# Patient Record
Sex: Female | Born: 1980
Health system: Southern US, Community
[De-identification: ages and names within clinical notes are randomized; demographics above are authoritative.]

## PROBLEM LIST (undated history)

## (undated) DIAGNOSIS — F329 Major depressive disorder, single episode, unspecified: Secondary | ICD-10-CM

## (undated) DIAGNOSIS — L509 Urticaria, unspecified: Secondary | ICD-10-CM

## (undated) DIAGNOSIS — F988 Other specified behavioral and emotional disorders with onset usually occurring in childhood and adolescence: Secondary | ICD-10-CM

## (undated) DIAGNOSIS — J309 Allergic rhinitis, unspecified: Secondary | ICD-10-CM

## (undated) DIAGNOSIS — G43909 Migraine, unspecified, not intractable, without status migrainosus: Secondary | ICD-10-CM

## (undated) DIAGNOSIS — D509 Iron deficiency anemia, unspecified: Secondary | ICD-10-CM

## (undated) DIAGNOSIS — F411 Generalized anxiety disorder: Secondary | ICD-10-CM

## (undated) DIAGNOSIS — E785 Hyperlipidemia, unspecified: Secondary | ICD-10-CM

## (undated) DIAGNOSIS — L259 Unspecified contact dermatitis, unspecified cause: Secondary | ICD-10-CM

## (undated) HISTORY — DX: Allergic rhinitis, unspecified: J30.9

## (undated) HISTORY — PX: TONSILLECTOMY: SUR1361

## (undated) HISTORY — DX: Hyperlipidemia, unspecified: E78.5

## (undated) HISTORY — DX: Generalized anxiety disorder: F41.1

## (undated) HISTORY — DX: Migraine, unspecified, not intractable, without status migrainosus: G43.909

## (undated) HISTORY — DX: Urticaria, unspecified: L50.9

## (undated) HISTORY — DX: Major depressive disorder, single episode, unspecified: F32.9

## (undated) HISTORY — DX: Other specified behavioral and emotional disorders with onset usually occurring in childhood and adolescence: F98.8

## (undated) HISTORY — DX: Unspecified contact dermatitis, unspecified cause: L25.9

## (undated) HISTORY — DX: Iron deficiency anemia, unspecified: D50.9

## (undated) HISTORY — PX: OTHER SURGICAL HISTORY: SHX169

---

## 2002-04-02 ENCOUNTER — Other Ambulatory Visit: Admission: RE | Admit: 2002-04-02 | Discharge: 2002-04-02 | Payer: Self-pay | Admitting: Obstetrics and Gynecology

## 2002-08-26 ENCOUNTER — Other Ambulatory Visit: Admission: RE | Admit: 2002-08-26 | Discharge: 2002-08-26 | Payer: Self-pay | Admitting: Obstetrics and Gynecology

## 2003-04-06 ENCOUNTER — Other Ambulatory Visit: Admission: RE | Admit: 2003-04-06 | Discharge: 2003-04-06 | Payer: Self-pay | Admitting: Obstetrics and Gynecology

## 2003-08-05 ENCOUNTER — Other Ambulatory Visit: Admission: RE | Admit: 2003-08-05 | Discharge: 2003-08-05 | Payer: Self-pay | Admitting: Obstetrics and Gynecology

## 2004-04-27 ENCOUNTER — Ambulatory Visit: Payer: Self-pay | Admitting: Internal Medicine

## 2004-05-03 ENCOUNTER — Other Ambulatory Visit: Admission: RE | Admit: 2004-05-03 | Discharge: 2004-05-03 | Payer: Self-pay | Admitting: Obstetrics and Gynecology

## 2004-07-27 ENCOUNTER — Encounter (INDEPENDENT_AMBULATORY_CARE_PROVIDER_SITE_OTHER): Payer: Self-pay | Admitting: *Deleted

## 2004-07-27 ENCOUNTER — Ambulatory Visit (HOSPITAL_COMMUNITY): Admission: RE | Admit: 2004-07-27 | Discharge: 2004-07-27 | Payer: Self-pay | Admitting: Obstetrics and Gynecology

## 2004-11-23 ENCOUNTER — Ambulatory Visit: Payer: Self-pay | Admitting: Internal Medicine

## 2005-01-19 ENCOUNTER — Other Ambulatory Visit: Admission: RE | Admit: 2005-01-19 | Discharge: 2005-01-19 | Payer: Self-pay | Admitting: Obstetrics and Gynecology

## 2005-04-17 ENCOUNTER — Other Ambulatory Visit: Admission: RE | Admit: 2005-04-17 | Discharge: 2005-04-17 | Payer: Self-pay | Admitting: Obstetrics and Gynecology

## 2005-07-11 ENCOUNTER — Other Ambulatory Visit: Admission: RE | Admit: 2005-07-11 | Discharge: 2005-07-11 | Payer: Self-pay | Admitting: Obstetrics and Gynecology

## 2005-07-31 ENCOUNTER — Ambulatory Visit (HOSPITAL_COMMUNITY): Admission: RE | Admit: 2005-07-31 | Discharge: 2005-07-31 | Payer: Self-pay | Admitting: Obstetrics and Gynecology

## 2005-07-31 ENCOUNTER — Encounter (INDEPENDENT_AMBULATORY_CARE_PROVIDER_SITE_OTHER): Payer: Self-pay | Admitting: *Deleted

## 2005-11-12 ENCOUNTER — Ambulatory Visit: Payer: Self-pay | Admitting: Internal Medicine

## 2005-12-25 ENCOUNTER — Other Ambulatory Visit: Admission: RE | Admit: 2005-12-25 | Discharge: 2005-12-25 | Payer: Self-pay | Admitting: Obstetrics and Gynecology

## 2006-11-06 ENCOUNTER — Ambulatory Visit: Payer: Self-pay | Admitting: Internal Medicine

## 2006-11-09 DIAGNOSIS — F988 Other specified behavioral and emotional disorders with onset usually occurring in childhood and adolescence: Secondary | ICD-10-CM | POA: Insufficient documentation

## 2006-11-09 DIAGNOSIS — G43909 Migraine, unspecified, not intractable, without status migrainosus: Secondary | ICD-10-CM | POA: Insufficient documentation

## 2006-11-09 DIAGNOSIS — F329 Major depressive disorder, single episode, unspecified: Secondary | ICD-10-CM

## 2006-11-09 DIAGNOSIS — F411 Generalized anxiety disorder: Secondary | ICD-10-CM | POA: Insufficient documentation

## 2006-11-09 DIAGNOSIS — F3289 Other specified depressive episodes: Secondary | ICD-10-CM

## 2006-11-09 DIAGNOSIS — F32A Depression, unspecified: Secondary | ICD-10-CM | POA: Insufficient documentation

## 2006-11-09 HISTORY — DX: Other specified depressive episodes: F32.89

## 2006-11-09 HISTORY — DX: Major depressive disorder, single episode, unspecified: F32.9

## 2006-11-09 HISTORY — DX: Migraine, unspecified, not intractable, without status migrainosus: G43.909

## 2006-11-09 HISTORY — DX: Generalized anxiety disorder: F41.1

## 2006-11-09 HISTORY — DX: Other specified behavioral and emotional disorders with onset usually occurring in childhood and adolescence: F98.8

## 2007-09-11 ENCOUNTER — Inpatient Hospital Stay (HOSPITAL_COMMUNITY): Admission: RE | Admit: 2007-09-11 | Discharge: 2007-09-13 | Payer: Self-pay | Admitting: Obstetrics and Gynecology

## 2008-06-03 ENCOUNTER — Ambulatory Visit: Payer: Self-pay | Admitting: Internal Medicine

## 2008-06-03 DIAGNOSIS — D509 Iron deficiency anemia, unspecified: Secondary | ICD-10-CM

## 2008-06-03 DIAGNOSIS — E785 Hyperlipidemia, unspecified: Secondary | ICD-10-CM

## 2008-06-03 HISTORY — DX: Iron deficiency anemia, unspecified: D50.9

## 2008-06-03 HISTORY — DX: Hyperlipidemia, unspecified: E78.5

## 2008-06-04 LAB — CONVERTED CEMR LAB
BUN: 14 mg/dL (ref 6–23)
Basophils Absolute: 0.1 10*3/uL (ref 0.0–0.1)
Basophils Relative: 1 % (ref 0.0–3.0)
Bilirubin Urine: NEGATIVE
CO2: 25 meq/L (ref 19–32)
Calcium: 10 mg/dL (ref 8.4–10.5)
Chloride: 107 meq/L (ref 96–112)
Cholesterol: 153 mg/dL (ref 0–200)
Creatinine, Ser: 0.8 mg/dL (ref 0.4–1.2)
Eosinophils Absolute: 0.1 10*3/uL (ref 0.0–0.7)
Eosinophils Relative: 2.3 % (ref 0.0–5.0)
GFR calc non Af Amer: 92.55 mL/min (ref >60)
Glucose, Bld: 87 mg/dL (ref 70–99)
HCT: 39.9 % (ref 36.0–46.0)
HDL: 46.5 mg/dL (ref >39.00)
Hemoglobin: 13.6 g/dL (ref 12.0–15.0)
Ketones, ur: NEGATIVE mg/dL
LDL Cholesterol: 87 mg/dL (ref 0–99)
Leukocytes, UA: NEGATIVE
Lymphocytes Relative: 43.2 % (ref 12.0–46.0)
Lymphs Abs: 2.3 10*3/uL (ref 0.7–4.0)
MCHC: 34 g/dL (ref 30.0–36.0)
MCV: 89 fL (ref 78.0–100.0)
Monocytes Absolute: 0.5 10*3/uL (ref 0.1–1.0)
Monocytes Relative: 9.4 % (ref 3.0–12.0)
Neutro Abs: 2.4 10*3/uL (ref 1.4–7.7)
Neutrophils Relative %: 44.1 % (ref 43.0–77.0)
Nitrite: NEGATIVE
Platelets: 429 10*3/uL — ABNORMAL HIGH (ref 150.0–400.0)
Potassium: 4.8 meq/L (ref 3.5–5.1)
RBC: 4.49 M/uL (ref 3.87–5.11)
RDW: 13.7 % (ref 11.5–14.6)
Sodium: 143 meq/L (ref 135–145)
Specific Gravity, Urine: 1.02 (ref 1.000–1.030)
TSH: 0.52 microintl units/mL (ref 0.35–5.50)
Total CHOL/HDL Ratio: 3
Total Protein, Urine: NEGATIVE mg/dL
Triglycerides: 98 mg/dL (ref 0.0–149.0)
Urine Glucose: NEGATIVE mg/dL
Urobilinogen, UA: 0.2 (ref 0.0–1.0)
VLDL: 19.6 mg/dL (ref 0.0–40.0)
WBC: 5.4 10*3/uL (ref 4.5–10.5)
pH: 7 (ref 5.0–8.0)

## 2008-06-10 LAB — CONVERTED CEMR LAB
ALT: 13 units/L (ref 0–35)
AST: 17 units/L (ref 0–37)
Albumin: 4.6 g/dL (ref 3.5–5.2)
Alkaline Phosphatase: 75 units/L (ref 39–117)
Bilirubin, Direct: 0.2 mg/dL (ref 0.0–0.3)
Total Bilirubin: 1.1 mg/dL (ref 0.3–1.2)
Total Protein: 7.7 g/dL (ref 6.0–8.3)

## 2008-06-23 ENCOUNTER — Encounter: Payer: Self-pay | Admitting: Internal Medicine

## 2008-12-07 ENCOUNTER — Ambulatory Visit: Payer: Self-pay | Admitting: Internal Medicine

## 2008-12-08 DIAGNOSIS — J309 Allergic rhinitis, unspecified: Secondary | ICD-10-CM

## 2008-12-08 HISTORY — DX: Allergic rhinitis, unspecified: J30.9

## 2009-06-02 ENCOUNTER — Ambulatory Visit: Payer: Self-pay | Admitting: Internal Medicine

## 2009-06-02 DIAGNOSIS — L259 Unspecified contact dermatitis, unspecified cause: Secondary | ICD-10-CM

## 2009-06-02 DIAGNOSIS — L309 Dermatitis, unspecified: Secondary | ICD-10-CM | POA: Insufficient documentation

## 2009-06-02 HISTORY — DX: Unspecified contact dermatitis, unspecified cause: L25.9

## 2009-12-09 ENCOUNTER — Telehealth: Payer: Self-pay | Admitting: Internal Medicine

## 2010-02-22 ENCOUNTER — Encounter: Payer: Self-pay | Admitting: Internal Medicine

## 2010-02-22 ENCOUNTER — Telehealth: Payer: Self-pay | Admitting: Internal Medicine

## 2010-04-18 NOTE — Assessment & Plan Note (Signed)
Summary: FU   $50   D/T   STC   Vital Signs:  Patient profile:   30 year old female Height:      66 inches Weight:      135.50 pounds BMI:     21.95 Temp:     98.4 degrees F oral Pulse rate:   76 / minute BP sitting:   122 / 66  (right arm) Cuff size:   regular  Vitals Entered By: Windell Norfolk (June 03, 2008 1:20 PM)   History of Present Illness: had a child since last seen 8/08; overall doing well, now with IUD in place, wants to re-start meds as prior  to pregnancy but also amenable to daily tx for anxiety such as cymbalta; denies depressive symptoms or recent panic attacks  Preventive Screening-Counseling & Management     Smoking Status: never  Problems Prior to Update: 1)  Preventive Health Care  (ICD-V70.0) 2)  Migraine Headache  (ICD-346.90) 3)  Depression  (ICD-311) 4)  Anxiety  (ICD-300.00) 5)  Add  (ICD-314.00)  Medications Prior to Update: 1)  None  Current Medications (verified): 1)  Adderall Xr 20 Mg Xr24h-Cap (Amphetamine-Dextroamphetamine) .Marland Kitchen.. 1 By Mouth Once Daily - To Fill Aug 02, 2008 2)  Alprazolam 0.5 Mg Tabs (Alprazolam) .... 1/2 - 1 By Mouth Once Daily As Needed Nerves 3)  Cymbalta 60 Mg Cpep (Duloxetine Hcl) .Marland Kitchen.. 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, prison and probation services    Never Smoked    Alcohol use-yes     (06/03/2008)  Risk Factors:    Alcohol Use: N/A    >5 drinks/d w/in last 3 months: N/A    Caffeine Use: N/A    Diet: N/A    Exercise: N/A  Risk Factors:    Smoking Status: never (06/03/2008)    Packs/Day: occasional (11/09/2006)    Cigars/wk: N/A    Pipe Use/wk: N/A    Cans of tobacco/wk: N/A    Passive Smoke Exposure: N/A  Past Medical History:    ADD    Anxiety    Depression    Migraines    Anemia-iron deficiency    Hyperlipidemia  Past Surgical  History:    s/p IUD    Tonsillectomy  Family History:    Reviewed history and no changes required:       father with multiple skin cancers - no melanoma, elevated cholesterol         Social History:    Reviewed history and no changes required:       Single       1 child       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, prison and probation services       Never Smoked       Alcohol use-yes    Smoking Status:  never  Review of Systems  The patient denies anorexia, fever, weight loss, weight gain, vision loss, decreased hearing, hoarseness, chest pain, syncope, dyspnea on exertion, peripheral edema, prolonged cough, headaches, hemoptysis, abdominal pain, melena, hematochezia, severe indigestion/heartburn, hematuria, incontinence, muscle weakness, suspicious skin lesions, transient blindness, difficulty walking, depression, unusual weight change, abnormal bleeding, enlarged lymph nodes, angioedema, and breast masses.         all otherwise negative   Physical Exam  General:  alert and well-developed.   Head:  normocephalic and atraumatic.   Eyes:  vision grossly intact, pupils equal, and pupils round.   Ears:  R ear normal and L ear normal.   Nose:  no external erythema and no nasal discharge.   Mouth:  no gingival abnormalities and pharynx pink and moist.   Neck:  supple and no masses.   Lungs:  normal respiratory effort and normal breath sounds.   Heart:  normal rate and regular rhythm.   Abdomen:  soft, non-tender, and normal bowel sounds.   Msk:  normal ROM, no joint tenderness, and no joint swelling.   Extremities:  no edema, no ulcers  Neurologic:  cranial nerves II-XII intact and strength normal in all extremities.     Impression & Recommendations:  Problem # 1:  Preventive Health Care (ICD-V70.0)  Overall doing well, up to date, counseled on routine health concerns for screening and prevention, immunizations up to date or declined, labs ordered  Orders: TLB-BMP (Basic Metabolic Panel-BMET)  (80048-METABOL) TLB-CBC Platelet - w/Differential (85025-CBCD) TLB-Hepatic/Liver Function Pnl (80076-HEPATIC) TLB-Lipid Panel (80061-LIPID) TLB-TSH (Thyroid Stimulating Hormone) (84443-TSH) TLB-Udip ONLY (81003-UDIP)  Problem # 2:  ANXIETY (ICD-300.00)  Her updated medication list for this problem includes:    Alprazolam 0.5 Mg Tabs (Alprazolam) .Marland Kitchen... 1/2 - 1 by mouth once daily as needed nerves    Cymbalta 60 Mg Cpep (Duloxetine hcl) .Marland Kitchen... 1 by mouth once daily treat as above, f/u any worsening signs or symptoms   Problem # 3:  ADD (ICD-314.00) gave 3 mo rx  - ok to treat as above, f/u any worsening signs or symptoms   Complete Medication List: 1)  Adderall Xr 20 Mg Xr24h-cap (Amphetamine-dextroamphetamine) .Marland Kitchen.. 1 by mouth once daily - to fill Aug 02, 2008 2)  Alprazolam 0.5 Mg Tabs (Alprazolam) .... 1/2 - 1 by mouth once daily as needed nerves 3)  Cymbalta 60 Mg Cpep (Duloxetine hcl) .Marland Kitchen.. 1 by mouth once daily  Other Orders: Tdap => 20yrs IM (51761) Admin 1st Vaccine (60737)  Patient Instructions: 1)  Please go to the Lab in the basement for your blood and urine tests today 2)  Please take all new medications as prescribed - the cymbalta - start the samples at 30 mg per day for 1 wk, then 60 mg per day after that (for nerves) 3)  Continue all medications that you may have been taking previously  - the adderall (you are given 3 prescriptions), and the alprazolam (for nerves) 4)  call if you need further refills 5)  Please schedule a follow-up appointment in 1 year or sooner if needed Prescriptions: CYMBALTA 60 MG CPEP (DULOXETINE HCL) 1 by mouth once daily  #30 x 11   Entered and Authorized by:   Corwin Levins MD   Signed by:   Corwin Levins MD on 06/03/2008   Method used:   Print then Give to Patient   RxID:   [id number] ALPRAZOLAM 0.5 MG TABS (ALPRAZOLAM) 1/2 - 1 by mouth once daily as needed nerves  #30 x 5   Entered and Authorized by:   Corwin Levins MD   Signed by:    Corwin Levins MD on 06/03/2008   Method used:   Print then Give to Patient   RxID:   [id number] ADDERALL XR 20 MG XR24H-CAP (AMPHETAMINE-DEXTROAMPHETAMINE) 1 by mouth once daily - to fill Aug 02, 2008  #30 x 0   Entered and Authorized by:   Fayrene Fearing  Ellin Mayhew MD   Signed by:   Corwin Levins MD on 06/03/2008   Method used:   Print then Give to Patient   RxID:   [id number] ADDERALL XR 20 MG XR24H-CAP (AMPHETAMINE-DEXTROAMPHETAMINE) 1 by mouth once daily - to fill Jul 03, 2008  #30 x 0   Entered and Authorized by:   Corwin Levins MD   Signed by:   Corwin Levins MD on 06/03/2008   Method used:   Print then Give to Patient   RxID:   [id number] ADDERALL XR 20 MG XR24H-CAP (AMPHETAMINE-DEXTROAMPHETAMINE) 1 by mouth once daily - to fill Jun 03, 2008  #30 x 0   Entered and Authorized by:   Corwin Levins MD   Signed by:   Corwin Levins MD on 06/03/2008   Method used:   Print then Give to Patient   RxID:   [id number]    Immunizations Administered:  Tetanus Vaccine:    Vaccine Type: Tdap    Immunizations Administered:  Tetanus Vaccine:    Vaccine Type: Tdap

## 2010-04-18 NOTE — Assessment & Plan Note (Signed)
Summary: FU ON MEDS /NWS   Vital Signs:  Patient profile:   30 year old female Height:      65 inches Weight:      126.25 pounds BMI:     21.09 O2 Sat:      98 % on Room air Temp:     97.8 degrees F oral Pulse rate:   97 / minute BP sitting:   118 / 70  (left arm) Cuff size:   regular  Vitals Entered ByZella Ball Ewing (June 02, 2009 1:40 PM)  O2 Flow:  Room air  CC: followup on meds, left hand rash/RE   CC:  followup on meds and left hand rash/RE.  History of Present Illness: overall doing well, no specific complaints;  Pt denies CP, sob, doe, wheezing, orthopnea, pnd, worsening LE edema, palps, dizziness or syncope   Good med complaicne and excellent tolerance, without undue wt loss, palps, tremors or anxiety increased.  Denies signfiicant worse depressive symptoms. or suicidal ideaiton or panic  No other new problems, successfully employed, needs med refills.  Labs from 2010 reviewed with pt.   Does have some mild nasal allergy congestion adn clearish dic with itch , as well as several areas of eczema type rash to hands.    Problems Prior to Update: 1)  Eczema  (ICD-692.9) 2)  Allergic Rhinitis  (ICD-477.9) 3)  Hyperlipidemia  (ICD-272.4) 4)  Anemia-iron Deficiency  (ICD-280.9) 5)  Preventive Health Care  (ICD-V70.0) 6)  Migraine Headache  (ICD-346.90) 7)  Depression  (ICD-311) 8)  Anxiety  (ICD-300.00) 9)  Add  (ICD-314.00)  Medications Prior to Update: 1)  Adderall Xr 20 Mg Xr24h-Cap (Amphetamine-Dextroamphetamine) .Marland Kitchen.. 1 By Mouth Once Daily - To Fill Feb 05, 2009 2)  Alprazolam 0.5 Mg Tabs (Alprazolam) .... 1/2 - 1 By Mouth Once Daily As Needed Nerves 3)  Citalopram Hydrobromide 20 Mg Tabs (Citalopram Hydrobromide) .Marland Kitchen.. 1 By Mouth Once Daily 4)  Fluticasone Propionate 50 Mcg/act Susp (Fluticasone Propionate) .... 2 Spary/side Once Daily  Current Medications (verified): 1)  Adderall Xr 20 Mg Xr24h-Cap (Amphetamine-Dextroamphetamine) .Marland Kitchen.. 1 By Mouth Once Daily - To Fill  Aug 01, 2009 2)  Alprazolam 0.5 Mg Tabs (Alprazolam) .... 1/2 - 1 By Mouth Once Daily As Needed Nerves 3)  Citalopram Hydrobromide 20 Mg Tabs (Citalopram Hydrobromide) .Marland Kitchen.. 1 By Mouth Once Daily 4)  Fluticasone Propionate 50 Mcg/act Susp (Fluticasone Propionate) .... 2 Spary/side Once Daily 5)  Loratadine 10 Mg Tabs (Loratadine) .Marland Kitchen.. 1po Once Daily As Needed Allergies 6)  Triamcinolone Acetonide 0.1 % Crea (Triamcinolone Acetonide) .... 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 s/p IUD Tonsillectomy  Review of Systems  The patient denies anorexia, fever, weight loss, weight gain, vision loss, decreased hearing, hoarseness, chest pain, syncope, dyspnea on exertion, peripheral edema, prolonged cough, headaches, hemoptysis, abdominal pain, melena, hematochezia, severe indigestion/heartburn, hematuria, muscle weakness, suspicious skin lesions, difficulty walking, depression, unusual weight change, abnormal bleeding, enlarged lymph nodes, and angioedema.         all otherwise negative  per pt -    Physical Exam  General:  alert and well-developed.   Head:  normocephalic and atraumatic.   Eyes:  vision grossly intact, pupils equal, and pupils round.   Ears:  R ear normal and L ear normal.   Nose:  no external deformity and no nasal discharge.   Mouth:  no gingival abnormalities and pharynx pink and moist.    Neck:  supple and no masses.   Lungs:  normal respiratory effort and normal breath sounds.   Heart:  normal rate and regular rhythm.   Abdomen:  soft, non-tender, and normal bowel sounds.   Msk:  no joint tenderness and no joint swelling.   Extremities:  no edema, no erythema  Neurologic:  cranial nerves II-XII intact and strength normal in all extremities.   Skin:  color normal and no rashes.   Psych:  not depressed appearing and slightly anxious.     Impression & Recommendations:  Problem # 1:  ALLERGIC RHINITIS (ICD-477.9)  Her updated medication list for this problem includes:    Fluticasone Propionate 50 Mcg/act Susp (Fluticasone propionate) .Marland Kitchen... 2 spary/side once daily    Loratadine 10 Mg Tabs (Loratadine) .Marland Kitchen... 1po once daily as needed allergies treat as above, f/u any worsening signs or symptoms   Problem # 2:  ECZEMA (ICD-692.9)  Her updated medication list for this problem includes:    Loratadine 10 Mg Tabs (Loratadine) .Marland Kitchen... 1po once daily as needed allergies    Triamcinolone Acetonide 0.1 % Crea (Triamcinolone acetonide) ..... Use asd once daily as needed treat as above, f/u any worsening signs or symptoms   Problem # 3:  ADD (ICD-314.00) stable overall by hx and exam, ok to continue meds/tx as is , for med refill today  Problem # 4:  DEPRESSION (ICD-311)  Her updated medication list for this problem includes:    Alprazolam 0.5 Mg Tabs (Alprazolam) .Marland Kitchen... 1/2 - 1 by mouth once daily as needed nerves    Citalopram Hydrobromide 20 Mg Tabs (Citalopram hydrobromide) .Marland Kitchen... 1 by mouth once daily stable overall by hx and exam, ok to continue meds/tx as is , for refills  Complete Medication List: 1)  Adderall Xr 20 Mg Xr24h-cap (Amphetamine-dextroamphetamine) .Marland Kitchen.. 1 by mouth once daily - to fill Aug 01, 2009 2)  Alprazolam 0.5 Mg Tabs (Alprazolam) .... 1/2 - 1 by mouth once daily as needed nerves 3)  Citalopram Hydrobromide 20 Mg Tabs (Citalopram hydrobromide) .Marland Kitchen.. 1 by  mouth once daily 4)  Fluticasone Propionate 50 Mcg/act Susp (Fluticasone propionate) .... 2 spary/side once daily 5)  Loratadine 10 Mg Tabs (Loratadine) .Marland Kitchen.. 1po once daily as needed allergies 6)  Triamcinolone Acetonide 0.1 % Crea (Triamcinolone acetonide) .... Use asd once daily as needed  Patient Instructions: 1)  Please take all new medications as prescribed 2)  Continue all previous medications as before this visit  3)  Please schedule a follow-up appointment in 1 year or sooner if needed 4)  please call for refills as needed Prescriptions: TRIAMCINOLONE ACETONIDE 0.1 % CREA (TRIAMCINOLONE ACETONIDE) use asd once daily as needed  #1 x 1   Entered and Authorized by:   Corwin Levins MD   Signed by:   Corwin Levins MD on 06/02/2009   Method used:   Print then Give to Patient   RxID:   [id number] FLUTICASONE PROPIONATE 50 MCG/ACT SUSP (FLUTICASONE PROPIONATE) 2 spary/side once daily  #3 x 3   Entered and Authorized by:   Len Blalock  John MD   Signed by:   Corwin Levins MD on 06/02/2009   Method used:   Print then Give to Patient   RxID:   [id number] CITALOPRAM HYDROBROMIDE 20 MG TABS (CITALOPRAM HYDROBROMIDE) 1 by mouth once daily  #90 x 3   Entered and Authorized by:   Corwin Levins MD   Signed by:   Corwin Levins MD on 06/02/2009   Method used:   Print then Give to Patient   RxID:   [id number] ALPRAZOLAM 0.5 MG TABS (ALPRAZOLAM) 1/2 - 1 by mouth once daily as needed nerves  #30 x 5   Entered and Authorized by:   Corwin Levins MD   Signed by:   Corwin Levins MD on 06/02/2009   Method used:   Print then Give to Patient   RxID:   [id number] ADDERALL XR 20 MG XR24H-CAP (AMPHETAMINE-DEXTROAMPHETAMINE) 1 by mouth once daily - to fill Aug 01, 2009  #30 x 0   Entered and Authorized by:   Corwin Levins MD   Signed by:   Corwin Levins MD on 06/02/2009   Method used:   Print then Give to Patient   RxID:   [id number] ADDERALL XR 20 MG XR24H-CAP  (AMPHETAMINE-DEXTROAMPHETAMINE) 1 by mouth once daily - to fill Jul 02, 2009  #30 x 0   Entered and Authorized by:   Corwin Levins MD   Signed by:   Corwin Levins MD on 06/02/2009   Method used:   Print then Give to Patient   RxID:   [id number] ADDERALL XR 20 MG XR24H-CAP (AMPHETAMINE-DEXTROAMPHETAMINE) 1 by mouth once daily - to fill Jun 02, 2009  #30 x 0   Entered and Authorized by:   Corwin Levins MD   Signed by:   Corwin Levins MD on 06/02/2009   Method used:   Print then Give to Patient   RxID:   [id number]

## 2010-04-18 NOTE — Progress Notes (Signed)
Summary: Adderall?  Phone Note Call from Patient Call back at Home Phone [phone] Call back at Work Phone [phone]   Caller: Patient Summary of Call: Pt called stating she lost Rxs for Adderall (registry shows Rx not filled). Pt is requesting replacement and was advised that MD usually does not replace lost meds and if he did she will get RXs month to month. Pt understood but still requested Rx request from MD. Initial call taken by: Margaret Pyle, CMA,  February 22, 2010 1:11 PM  Follow-up for Phone Call        done hardcopy to LIM side B - dahlia  Follow-up by: Corwin Levins MD,  February 22, 2010 1:37 PM  Additional Follow-up for Phone Call Additional follow up Details #1::        Pt advised via VM, Rx in cabinet for pt pick up Additional Follow-up by: Margaret Pyle, CMA,  February 22, 2010 4:34 PM    New/Updated Medications: ADDERALL XR 20 MG XR24H-CAP (AMPHETAMINE-DEXTROAMPHETAMINE) 1 by mouth once daily - to fill Feb 22, 2010 Prescriptions: ADDERALL XR 20 MG XR24H-CAP (AMPHETAMINE-DEXTROAMPHETAMINE) 1 by mouth once daily - to fill Feb 22, 2010  #30 x 0   Entered and Authorized by:   Corwin Levins MD   Signed by:   Corwin Levins MD on 02/22/2010   Method used:   Print then Give to Patient   RxID:   [id number]

## 2010-04-18 NOTE — Progress Notes (Signed)
Summary: Adderall Xanax  Phone Note Call from Patient Call back at Home Phone [phone]   Caller: Patient Summary of Call: Pt called requesting refill of Adderall and Xanax. Initial call taken by: Margaret Pyle, CMA,  December 09, 2009 9:47 AM  Follow-up for Phone Call        done hardcopy to LIM side B - dahlia  Follow-up by: Corwin Levins MD,  December 09, 2009 1:03 PM  Additional Follow-up for Phone Call Additional follow up Details #1::        Pt informed, Rx in cabinet for pt pick up Additional Follow-up by: Margaret Pyle, CMA,  December 09, 2009 1:23 PM    New/Updated Medications: ADDERALL XR 20 MG XR24H-CAP (AMPHETAMINE-DEXTROAMPHETAMINE) 1 by mouth once daily - to fill sept 23, 2011 ADDERALL XR 20 MG XR24H-CAP (AMPHETAMINE-DEXTROAMPHETAMINE) 1 by mouth once daily - to fill Jan 08, 2010 ADDERALL XR 20 MG XR24H-CAP (AMPHETAMINE-DEXTROAMPHETAMINE) 1 by mouth once daily - to fill Feb 07, 2010 Prescriptions: ALPRAZOLAM 0.5 MG TABS (ALPRAZOLAM) 1/2 - 1 by mouth once daily as needed nerves  #30 x 5   Entered and Authorized by:   Corwin Levins MD   Signed by:   Corwin Levins MD on 12/09/2009   Method used:   Print then Give to Patient   RxID:   [id number] ADDERALL XR 20 MG XR24H-CAP (AMPHETAMINE-DEXTROAMPHETAMINE) 1 by mouth once daily - to fill Feb 07, 2010  #30 x 0   Entered and Authorized by:   Corwin Levins MD   Signed by:   Corwin Levins MD on 12/09/2009   Method used:   Print then Give to Patient   RxID:   [id number] ADDERALL XR 20 MG XR24H-CAP (AMPHETAMINE-DEXTROAMPHETAMINE) 1 by mouth once daily - to fill Jan 08, 2010  #30 x 0   Entered and Authorized by:   Corwin Levins MD   Signed by:   Corwin Levins MD on 12/09/2009   Method used:   Print then Give to Patient   RxID:   [id number] ADDERALL XR 20 MG XR24H-CAP (AMPHETAMINE-DEXTROAMPHETAMINE) 1 by mouth once daily - to fill sept 23, 2011  #30 x 0   Entered and Authorized by:   Corwin Levins MD   Signed by:   Corwin Levins MD on 12/09/2009   Method used:   Print then Give to Patient   RxID:   [id number]

## 2010-04-18 NOTE — Miscellaneous (Signed)
Summary: [facility]   Imported By: Lester [place] 06/08/2009 10:47:13  _____________________________________________________________________  External Attachment:    Type:   Image     Comment:   External Document

## 2010-04-18 NOTE — Medication Information (Signed)
Summary: P.A. DEXTROAMPHETAMINE-AMPH/Approved til 06/23/09/[facility]  P.A. DEXTROAMPHETAMINE-AMPH/Approved til 06/23/09/[facility]   Imported By: Lester [place] 06/25/2008 11:00:13  _____________________________________________________________________  External Attachment:    Type:   Image     Comment:   External Document

## 2010-04-20 NOTE — Medication Information (Signed)
Summary: Patient Rx History  Patient Rx History   Imported By: Sherian Rein 02/27/2010 10:39:58  _____________________________________________________________________  External Attachment:    Type:   Image     Comment:   External Document

## 2010-06-22 ENCOUNTER — Telehealth: Payer: Self-pay

## 2010-06-22 NOTE — Telephone Encounter (Signed)
A user error has taken place: encounter opened in error, closed for administrative reasons.

## 2010-07-11 ENCOUNTER — Other Ambulatory Visit: Payer: Self-pay | Admitting: Internal Medicine

## 2010-07-11 MED ORDER — FLUTICASONE PROPIONATE 50 MCG/ACT NA SUSP: 1.0000 | Freq: Every day | NASAL | Status: DC

## 2010-07-11 MED ORDER — LORATADINE 10 MG PO TABS: 10.0000 mg | ORAL_TABLET | Freq: Every day | ORAL | Status: DC

## 2010-07-11 MED ORDER — AMPHETAMINE-DEXTROAMPHET ER 20 MG PO CP24: 20.0000 mg | ORAL_CAPSULE | ORAL | Status: DC

## 2010-07-11 MED ORDER — ALPRAZOLAM 0.5 MG PO TABS: ORAL_TABLET | ORAL | Status: DC

## 2010-07-11 MED ORDER — CITALOPRAM HYDROBROMIDE 20 MG PO TABS: 20.0000 mg | ORAL_TABLET | Freq: Every day | ORAL | Status: DC

## 2010-07-11 NOTE — Telephone Encounter (Signed)
Pt sched yearly f/u does not want cpx. She is out of all meds. Pt set up appt for 5/1 but is out of meds now. [facility] in [place] is her current pharmacy.

## 2010-07-11 NOTE — Telephone Encounter (Signed)
2 rx  Done hardcopy to dahlia/LIM B  3 rx sent escript

## 2010-07-11 NOTE — Telephone Encounter (Signed)
Ok for one mo meds   - To robin

## 2010-07-12 NOTE — Telephone Encounter (Signed)
Pt informed, Rx in cabinet for pt pick up  

## 2010-07-16 ENCOUNTER — Encounter: Payer: Self-pay | Admitting: Internal Medicine

## 2010-07-16 DIAGNOSIS — Z0001 Encounter for general adult medical examination with abnormal findings: Secondary | ICD-10-CM | POA: Insufficient documentation

## 2010-07-16 DIAGNOSIS — Z0000 Encounter for general adult medical examination without abnormal findings: Secondary | ICD-10-CM | POA: Insufficient documentation

## 2010-07-18 ENCOUNTER — Encounter: Payer: Self-pay | Admitting: Internal Medicine

## 2010-07-18 ENCOUNTER — Ambulatory Visit (INDEPENDENT_AMBULATORY_CARE_PROVIDER_SITE_OTHER): Payer: BC Managed Care – PPO | Admitting: Internal Medicine

## 2010-07-18 VITALS — BP 104/64 | HR 92 | Temp 98.4°F | Ht 66.0 in | Wt 137.5 lb

## 2010-07-18 DIAGNOSIS — Z0000 Encounter for general adult medical examination without abnormal findings: Secondary | ICD-10-CM

## 2010-07-18 DIAGNOSIS — F411 Generalized anxiety disorder: Secondary | ICD-10-CM

## 2010-07-18 DIAGNOSIS — F329 Major depressive disorder, single episode, unspecified: Secondary | ICD-10-CM

## 2010-07-18 DIAGNOSIS — F988 Other specified behavioral and emotional disorders with onset usually occurring in childhood and adolescence: Secondary | ICD-10-CM

## 2010-07-18 MED ORDER — AMPHETAMINE-DEXTROAMPHETAMINE 20 MG PO TABS: 20.0000 mg | ORAL_TABLET | Freq: Every day | ORAL | Status: DC

## 2010-07-18 MED ORDER — ALPRAZOLAM 0.5 MG PO TABS: ORAL_TABLET | ORAL | Status: DC

## 2010-07-18 NOTE — Assessment & Plan Note (Signed)
stable overall by hx and exam, most recent lab reviewed with pt, and pt to continue medical treatment as before, for med refills today  Lab Results  Component Value Date   WBC 5.4 06/03/2008   HGB 13.6 06/03/2008   HCT 39.9 06/03/2008   PLT 429.0* 06/03/2008   CHOL 153 06/03/2008   TRIG 98.0 06/03/2008   HDL 46.50 06/03/2008   ALT 13 06/03/2008   AST 17 06/03/2008   NA 143 06/03/2008   K 4.8 06/03/2008   CL 107 06/03/2008   CREATININE 0.8 06/03/2008   BUN 14 06/03/2008   CO2 25 06/03/2008   TSH 0.52 06/03/2008

## 2010-07-18 NOTE — Assessment & Plan Note (Signed)
stable overall by hx and exam,  and pt to continue medical treatment as before  - med change to the nonXR version today of adderall

## 2010-07-18 NOTE — Assessment & Plan Note (Signed)

## 2010-07-18 NOTE — Assessment & Plan Note (Signed)
stable overall by hx and exam,and pt to continue as is,  to f/u any worsening symptoms or concerns

## 2010-07-18 NOTE — Patient Instructions (Signed)
Continue all other medications as before Please return in 1 year for your yearly visit, or sooner if needed, with Lab testing done 3-5 days before  

## 2010-07-18 NOTE — Progress Notes (Signed)
Subjective:    Patient ID: Dana Cervantes, female    DOB: [birth year], 30 y.o.   MRN: [id number]  HPI   Here for f/u;  Overall doing ok;  Pt denies CP, worsening SOB, DOE, wheezing, orthopnea, PND, worsening LE edema, palpitations, dizziness or syncope.  Pt denies neurological change such as new Headache, facial or extremity weakness.  Pt denies polydipsia, polyuria, or low sugar symptoms. Pt states overall good compliance with treatment and medications, good tolerability, and trying to follow lower cholesterol diet.  Pt denies worsening depressive symptoms, suicidal ideation or panic. No fever, wt loss, night sweats, loss of appetite, or other constitutional symptoms.  Pt states good ability with ADL's, low fall risk, home safety reviewed and adequate, no significant changes in hearing or vision, and occasionally active with exercise.  States ADD med working ok but lasts into the evening and causes insomnia on occasion and asks to change to the non-XR version.  Still working successfully, has 3yo daughter and overall feels well. Does not want labs today.  Does not feel she need further med for depression such as citalopram, declines need for counseling Past Medical History  Diagnosis Date  . HYPERLIPIDEMIA 06/03/2008  . ANEMIA-IRON DEFICIENCY 06/03/2008  . ANXIETY 11/09/2006  . DEPRESSION 11/09/2006  . ADD 11/09/2006  . MIGRAINE HEADACHE 11/09/2006  . ALLERGIC RHINITIS 12/08/2008  . ECZEMA 06/02/2009   Past Surgical History  Procedure Date  . S/p iud   . Tonsillectomy     reports that she has never smoked. She does not have any smokeless tobacco history on file. She reports that she drinks alcohol. Her drug history not on file. family history includes Cancer in her father and Hyperlipidemia in her father. No Known Allergies Current Outpatient Prescriptions on File Prior to Visit  Medication Sig Dispense Refill  . amphetamine-dextroamphetamine (ADDERALL XR, 20MG ,) 20 MG 24 hr capsule Take 1 capsule  (20 mg total) by mouth every morning.  30 capsule  0  . DISCONTD: ALPRAZolam (XANAX) 0.5 MG tablet 1/2 - 1 by mouth once daily as needed for nerves  30 tablet  0  . amphetamine-dextroamphetamine (ADDERALL XR, 20MG ,) 20 MG 24 hr capsule Take 20 mg by mouth daily.        Marland Kitchen DISCONTD: ALPRAZolam (XANAX) 0.5 MG tablet 1/2 - 1 by mouth once daily as needed for nerves       . DISCONTD: citalopram (CELEXA) 20 MG tablet Take 1 tablet (20 mg total) by mouth daily.  30 tablet  0  . DISCONTD: citalopram (CELEXA) 20 MG tablet Take 20 mg by mouth daily.        Marland Kitchen DISCONTD: fluticasone (FLONASE) 50 MCG/ACT nasal spray 1 spray by Nasal route daily.  16 g  0  . DISCONTD: fluticasone (FLONASE) 50 MCG/ACT nasal spray 2 sprays by Nasal route daily.        Marland Kitchen DISCONTD: loratadine (CLARITIN) 10 MG tablet Take 1 tablet (10 mg total) by mouth daily.  30 tablet  0  . DISCONTD: loratadine (CLARITIN) 10 MG tablet Take 10 mg by mouth daily.        Marland Kitchen DISCONTD: triamcinolone (KENALOG) 0.1 % cream Apply topically daily.         Review of Systems Review of Systems  Constitutional: Negative for diaphoresis, activity change, appetite change and unexpected weight change.  HENT: Negative for hearing loss, ear pain, facial swelling, mouth sores and neck stiffness.   Eyes: Negative for pain, redness and visual disturbance.  Respiratory: Negative for shortness of breath and wheezing.   Cardiovascular: Negative for chest pain and palpitations.  Gastrointestinal: Negative for diarrhea, blood in stool, abdominal distention and rectal pain.  Genitourinary: Negative for hematuria, flank pain and decreased urine volume.  Musculoskeletal: Negative for myalgias and joint swelling.  Skin: Negative for color change and wound.  Neurological: Negative for syncope and numbness.  Hematological: Negative for adenopathy.  Psychiatric/Behavioral: Negative for hallucinations, self-injury, decreased concentration and agitation.      Objective:    Physical Exam BP 104/64  Pulse 92  Temp(Src) 98.4 F (36.9 C) (Oral)  Ht 5\' 6"  (1.676 m)  Wt 137 lb 8 oz (62.37 kg)  BMI 22.19 kg/m2  SpO2 97%  LMP 06/18/2010 Physical Exam  VS noted Constitutional: Pt is oriented to person, place, and time. Appears well-developed and well-nourished.  HENT:  Head: Normocephalic and atraumatic.  Right Ear: External ear normal.  Left Ear: External ear normal.  Nose: Nose normal.  Mouth/Throat: Oropharynx is clear and moist.  Eyes: Conjunctivae and EOM are normal. Pupils are equal, round, and reactive to light.  Neck: Normal range of motion. Neck supple. No JVD present. No tracheal deviation present.  Cardiovascular: Normal rate, regular rhythm, normal heart sounds and intact distal pulses.   Pulmonary/Chest: Effort normal and breath sounds normal.  Abdominal: Soft. Bowel sounds are normal. There is no tenderness.  Musculoskeletal: Normal range of motion. Exhibits no edema.  Lymphadenopathy:  Has no cervical adenopathy.  Neurological: Pt is alert and oriented to person, place, and time. Pt has normal reflexes. No cranial nerve deficit.  Skin: Skin is warm and dry. No rash noted.  Psychiatric:  Has  normal mood and affect. Behavior is normal. slight nervous today       Assessment & Plan:

## 2010-08-01 NOTE — Discharge Summary (Signed)
Dana Cervantes, Dana Cervantes               ACCOUNT NO.:  [id number]   MEDICAL RECORD NO.:  [id number]          PATIENT TYPE:  INP   LOCATION:  9121                          FACILITY:  [facility]   PHYSICIAN:  Zenaida Niece, M.D.DATE OF BIRTH:  [birth year]   DATE OF ADMISSION:  09/11/2007  DATE OF DISCHARGE:  09/13/2007                               DISCHARGE SUMMARY   ADMISSION DIAGNOSIS:  Intrauterine pregnancy at 39 weeks.   DISCHARGE DIAGNOSIS:  Intrauterine pregnancy at 39 weeks.   PROCEDURE:  On June 25, she had a spontaneous vaginal delivery.   HISTORY AND PHYSICAL:  This is a 30 year old white female gravida 1,  para 0 with an EGA of [redacted] weeks, who presents for elective induction.  Prenatal care uncomplicated.  She was monitored closely due to multiple  surgical procedures on her cervix.   PRENATAL LABORATORIES:  Blood type is O+ with a negative antibody  screen, RPR nonreactive, rubella immune, hepatitis B surface antigen  negative, HIV negative, gonorrhea and chlamydia negative, group B strep  is negative, 1-hour Glucola was elevated, 3-hour GTT was normal first  trimester screen was normal.   PAST GYN HISTORY:  History of LEEP and cervical conization x2 for CIN  III.   ALLERGIES:  None.   The remainder of her history is noncontributory.   PHYSICAL EXAMINATION:  She is afebrile with stable vital signs.  Fetal  heart tracing reactive.  ABDOMEN:  Soft.  Cervix 2+, 50-60, -2, and Dr. Senaida Ores ruptured  membranes that revealed clear fluid.   HOSPITAL COURSE:  The patient was admitted and started on Pitocin and  Dr. Senaida Ores was then able to rupture membranes.  She progressed into  labor, progressed to complete, pushed well, and on the evening of June  25, had a vaginal delivery of a viable female infant that weighed 7  pounds 11 ounces with Apgars of 8 and 9.  She had a right labial  laceration repaired with 2-0 and 3-0 Vicryl.  Placenta delivered  spontaneous and estimated  blood loss blood loss was 350 mL.  Postpartum,  she had no significant complications.  Predelivery hemoglobin 10.8,  postdelivery 9.6.  On postpartum #2, she was felt be stable enough for  discharge home.   DISCHARGE INSTRUCTIONS:  Regular diet, pelvic rest, follow-up in 6  weeks.   Medications are Percocet #20 1-2 p.o. q.4-6 h. p.r.n. pain and over-the-  counter ibuprofen as needed.  She is to take over-the-counter iron daily  and she was given our discharge pamphlet.      Zenaida Niece, M.D.  Electronically Signed     TDM/MEDQ  D:  09/13/2007  T:  09/13/2007  Job:  [id number]

## 2010-08-04 NOTE — H&P (Signed)
NAME:  Dana Cervantes, Dana Cervantes NO.:  [id number]   MEDICAL RECORD NO.:  [id number]           PATIENT TYPE:   LOCATION:                                 FACILITY:   PHYSICIAN:  Huel Cote, M.D.      DATE OF BIRTH:   DATE OF ADMISSION:  DATE OF DISCHARGE:                                HISTORY & PHYSICAL   PREOPERATIVE HISTORY AND PHYSICAL   HISTORY:  The patient is a 30 year old, G0 who is coming in for a repeat  cold knife conization given a persistent high-grade dysplasia noted on Pap  smears.  The patient has a long history of abnormal Pap smears dating back  several years for which she has been followed carefully.  The patient  underwent a LEEP procedure in March 2004 and had some improvement of her Pap  smears for approximately 2 years, however, developed high-grade dysplasia in  the spring of 2006 for which she underwent a cold knife conization.  At that  time the endocervical margins were questionable and the patient has been  followed closely with Pap smears and endocervical curettage since that time  on her Pap smear in November 2006.  She had normal ECC and ASCUS Pap smear.  Followup Pap smear performed in January 2007 demonstrated a high-grade Pap  smear and endocervical curettage confirmed that these seemed to be high-  grade in nature given that these were found on her endocervical curettage  even though they appear to be squamous in origin.  It was carefully  discussed with the patient that she would need a further treatment; however,  it was felt that a cold knife conization would be more appropriate for  pathology extending into the endocervical canal.   PAST MEDICAL HISTORY:  None.   PAST SURGICAL HISTORY:  The LEEP and the cold knife cone only.   PAST OBSTETRICAL HISTORY:  None.   PAST GYNECOLOGICAL HISTORY:  Abnormal Pap smears, as above.   FAMILY HISTORY:  No breast cancer.   PHYSICAL EXAMINATION:  VITAL SIGNS:  Her weight is 116 pounds,  blood  pressure is 110/65.  BREASTS EXAM:  Normal.  CARDIAC EXAM:  Regular rate and rhythm.  LUNGS:  Clear.  ABDOMEN:  Soft and nontender.  PELVIC:  Normal genitalia noted.  Uterus is small and anteverted.  Cervix is  well-healed from previous procedures with minimal scarring noted.   The risks and benefits of cold knife conization were discussed with the  patient in detail including bleeding and cervical scarring or cervical  weakening, as it certainly is not desirable to perform repeat cervical  procedures on a nulligravida female.  We have been tried to be conservative  in this regard; however, with persistent high-grade dysplasia in the  endocervical canal; it is felt that this can not be avoided.  We will plan a  cold knife conization with  extensive cautery of the endocervical canal in the hopes of further clearing  this pathology.  The patient understands and accepts the risks as outlined  for her.  Her current medications include Yasmin birth control.  ALLERGIES:  She has no known drug allergies.      Huel Cote, M.D.  Electronically Signed     KR/MEDQ  D:  07/27/2005  T:  07/27/2005  Job:  [id number]

## 2010-08-04 NOTE — Op Note (Signed)
NAMESHEALA, DOSH               ACCOUNT NO.:  [id number]   MEDICAL RECORD NO.:  [id number]          PATIENT TYPE:  AMB   LOCATION:  SDC                           FACILITY:  [facility]   PHYSICIAN:  Huel Cote, M.D. DATE OF BIRTH:  [birth year]   DATE OF PROCEDURE:  07/27/2004  DATE OF DISCHARGE:                                 OPERATIVE REPORT   PREOPERATIVE DIAGNOSIS:  Cervical intraepithelial neoplasia II, status post  a loop electrosurgical excision procedure previously.   POSTOPERATIVE DIAGNOSIS:  Cervical intraepithelial neoplasia II, status post  a loop electrosurgical excision procedure previously,  pending final pathology.   PROCEDURE:  Cold knife conization.   SURGEON:  Dr. Huel Cote.   ANESTHESIA:  MAC and local 1% Xylocaine.   SPECIMEN:  Cone of ectocervix and endocervix sent.   ESTIMATED BLOOD LOSS:  Less than 100 mL.   PROCEDURE:  The patient was taken to the OR, where MAC anesthesia was  obtained without difficulty.  She was then prepped and draped in normal  sterile fashion in the dorsal lithotomy position.  A speculum was then  placed within the vagina and the cervix was identified and grasped with  single-tooth tenaculum.  Xylocaine 1% plain was then injected both 2 and 10  o'clock to add to the patient's comfort and anesthetic level.  Two stay  sutures were then placed from the 2 to 4 o'clock position and the 10 to 8  o'clock position and tied down.  These were then pulled out gently and the  cervix then was incised circumferentially just around the cervical os with a  conization specimen removed in entirety with the assistance of the Mayo  scissors.  This was then handed off for pathology and marked at the 12  o'clock position with a suture.  The rollerball cautery unit was then used  to cauterize the base additionally.  There was still some bleeding noted on  the anterior cervix in the actual bed of the cone; therefore, one additional  hemostatic  suture was placed in a modified mattress suture.  This gave good  result with hemostasis noted anteriorly.  A small amount of oozing  posteriorly was then controlled with Bovie cautery and Monsel's solution.  At the conclusion of the procedure, there was no active bleeding noted and  the stay sutures were then cut and the patient taken to the recovery room in  stable condition.      KR/MEDQ  D:  07/27/2004  T:  07/27/2004  Job:  [id number]

## 2010-08-04 NOTE — H&P (Signed)
NAME:  Dana Cervantes, Dana Cervantes               ACCOUNT NO.:  [id number]   MEDICAL RECORD NO.:  [id number]          PATIENT TYPE:  AMB   LOCATION:  SDC                           [facility]   PHYSICIAN:  Huel Cote, M.D. DATE OF BIRTH:  [birth year]   DATE OF ADMISSION:  07/27/2004  DATE OF DISCHARGE:                                HISTORY & PHYSICAL   HISTORY OF PRESENT ILLNESS:  The patient is a 30 year old nulligravid female  who is presenting for a scheduled cold knife conization of the cervix given  a finding of recurrent CIN2.  The patient previously had a diagnosis CIN2 in  March of 2004 and underwent a LEEP procedure at that time with negative  pathology.  Her subsequent Pap smears for 2 years were consistent with ASCUS  or low grade changes only until February of 2006 when she had a low grade  Pap smear.  At this point, her colposcopy was repeated and one area of  acetowhite epithelium was clearly visible just at the squamocolumnar  junction at approximately 10 to 12 o'clock.  This was biopsied and found to  be consistent with CIN2.   PAST MEDICAL HISTORY:  None.   PAST SURGICAL HISTORY:  None.   PAST OBSTETRICAL HISTORY:  None.   PAST GYN HISTORY:  Her abnormal Pap smears as above only.   MEDICATIONS:  Yasmin.   ALLERGIES:  No known drug allergies.   FAMILY HISTORY:  No significant family history.   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   PHYSICAL EXAMINATION:  VITAL SIGNS:  Blood pressure 99/60, weight 114  pounds.  HEART:  Regular rate and rhythm.  LUNGS:  Clear.  ABDOMEN:  Soft and nontender.  PELVIC:  She has normal external genitalia.  The cervix had the lesion on  colposcopy as noted and no other ectocervical lesions were noted.  Uterus  and ovaries are within normal limits.   The patient was counseled on the risks and benefits of the procedure  including bleeding and infection and possible recurrence of dysplasia.  She  understands these risks and desires to proceed with the surgery as stated.  We discussed  a conservative cold knife conization given her young age and nulligravid  status.  She understands there is some possibility of cervical weakening or  cervical stenosis after such a procedure, however, we will be performing the  most conservative procedure possible given her young age and removing only  what tissue seems necessary.      KR/MEDQ  D:  07/20/2004  T:  07/20/2004  Job:  [id number]

## 2010-08-04 NOTE — Op Note (Signed)
NAME:  Dana Cervantes, Dana Cervantes               ACCOUNT NO.:  [id number]   MEDICAL RECORD NO.:  [id number]          PATIENT TYPE:  AMB   LOCATION:  SDC                           [facility]ILITY:  [facility]   PHYSICIAN:  Huel Cote, M.D. DATE OF BIRTH:  1980-10-04   DATE OF PROCEDURE:  07/31/2005  DATE OF DISCHARGE:                                 OPERATIVE REPORT   PREOPERATIVE DIAGNOSIS:  Persistent high-grade dysplasia of cervix.   POSTOPERATIVE DIAGNOSIS:  Persistent high-grade dysplasia of cervix.   PROCEDURE:  Cold knife conization.   SURGEON:  Huel Cote, M.D.   ANESTHESIA:  LMA with 1% lidocaine block locally.   ESTIMATED BLOOD LOSS:  100 mL.   SPECIMENS:  The cervical cone.   PROCEDURE:  The patient was taken to the operating room where LMA anesthesia  was obtained without difficulty.  She was then prepped and draped in a  normal sterile fashion in the dorsal lithotomy position.  A speculum was  then placed within the vagina.  The cervix was identified, grasped with a  single tooth tenaculum, and 1% plain lidocaine injected, approximately 5 mL,  at 2 and 10 o'clock. At this point, stay sutures were then placed on either  side of the cervix covering the 3 and 9 o'clock position.  These were gently  retracted forward and the knife was utilized to remove a cervical cone  specimen of the cervical os.  This was trimmed down with Mayo scissors  internally and marked at the 12 o'clock position to be sent to pathology.  The cone bed itself was then cauterized with roller ball cautery as well as  the margin, particularly at approximately 4 o'clock as there appeared to be  a small fold of cervix still present there, ectocervix folding inward. After  the roller ball had been utilized and Monsel's applied to the base, there  was still some slow bleeding noted.  Therefore, two additional sutures of 0  Vicryl were placed on both the anterior and posterior cervix.  These were  placed in a somewhat  mattress suture going in from the ectocervix to the  endocervix and then back out to the ectocervix again and tied down.  At this  point, good hemostasis was noted.  There was no significant active bleeding  noted and all sutures were removed and the patient was awakened and taken to  the recovery room in stable condition.      Huel Cote, M.D.  Electronically Signed     KR/MEDQ  D:  07/31/2005  T:  07/31/2005  Job:  [id number]

## 2010-11-01 ENCOUNTER — Other Ambulatory Visit: Payer: Self-pay

## 2010-11-01 MED ORDER — ALPRAZOLAM 0.5 MG PO TABS: ORAL_TABLET | ORAL | Status: DC

## 2010-11-01 NOTE — Telephone Encounter (Signed)
Pt informed, Rx in cabinet for pt pick up  

## 2010-12-14 LAB — CBC
HCT: 28.1 — ABNORMAL LOW
HCT: 31.3 — ABNORMAL LOW
Hemoglobin: 10.8 — ABNORMAL LOW
Hemoglobin: 9.6 — ABNORMAL LOW
MCHC: 34.3
MCHC: 34.3
MCV: 90.2
MCV: 90.7
Platelets: 236
Platelets: 300
RBC: 3.1 — ABNORMAL LOW
RBC: 3.48 — ABNORMAL LOW
RDW: 14.2
RDW: 14.4
WBC: 10.1
WBC: 12.9 — ABNORMAL HIGH

## 2010-12-14 LAB — RPR: RPR Ser Ql: NONREACTIVE

## 2011-01-18 ENCOUNTER — Other Ambulatory Visit: Payer: Self-pay

## 2011-01-18 MED ORDER — ALPRAZOLAM 0.5 MG PO TABS: ORAL_TABLET | ORAL | Status: DC

## 2011-01-18 NOTE — Telephone Encounter (Signed)
Rx faxed to pharmacy  

## 2011-01-30 ENCOUNTER — Other Ambulatory Visit: Payer: Self-pay

## 2011-01-30 DIAGNOSIS — F988 Other specified behavioral and emotional disorders with onset usually occurring in childhood and adolescence: Secondary | ICD-10-CM

## 2011-01-30 MED ORDER — AMPHETAMINE-DEXTROAMPHETAMINE 20 MG PO TABS: 20.0000 mg | ORAL_TABLET | Freq: Every day | ORAL | Status: DC

## 2011-01-30 NOTE — Telephone Encounter (Signed)
adderall Done hardcopy to robin   Xanax too soon - just done nov 1 , 2012

## 2011-01-30 NOTE — Telephone Encounter (Signed)
Called the patient informed prescription requested is ready for pickup 

## 2011-01-30 NOTE — Telephone Encounter (Signed)
Patient is requesting a refill on Alprazolam and Adderall. She never received the Alprazolam fill on 01/18/2011 it was sent to the wrong pharmacy, please advise

## 2011-02-07 ENCOUNTER — Other Ambulatory Visit: Payer: Self-pay

## 2011-02-07 MED ORDER — ALPRAZOLAM 0.5 MG PO TABS: ORAL_TABLET | ORAL | Status: DC

## 2011-02-07 MED ORDER — FLUTICASONE PROPIONATE 50 MCG/ACT NA SUSP: 2.0000 | Freq: Every day | NASAL | Status: DC

## 2011-02-07 NOTE — Telephone Encounter (Signed)
Done hardcopy to robin, and flonase sent to pharmacy

## 2011-02-07 NOTE — Telephone Encounter (Signed)
Patient is requesting a refill on Xanax she stated her pharmacy in [place] never received. Also would like her nasal spray refilled both go to [facility].

## 2011-02-07 NOTE — Telephone Encounter (Signed)
Called patient left message that prescriptions requested have been sent to her pharmacy in [place]. Faxed hardcopy

## 2011-02-28 ENCOUNTER — Other Ambulatory Visit: Payer: Self-pay

## 2011-02-28 DIAGNOSIS — F988 Other specified behavioral and emotional disorders with onset usually occurring in childhood and adolescence: Secondary | ICD-10-CM

## 2011-02-28 MED ORDER — AMPHETAMINE-DEXTROAMPHETAMINE 20 MG PO TABS: 20.0000 mg | ORAL_TABLET | Freq: Every day | ORAL | Status: DC

## 2011-02-28 NOTE — Telephone Encounter (Signed)
Done hardcopy to robin  

## 2011-03-01 NOTE — Telephone Encounter (Signed)
Patient informed to pickup prescription requested at front desk 

## 2011-04-03 ENCOUNTER — Other Ambulatory Visit: Payer: Self-pay

## 2011-04-03 DIAGNOSIS — F988 Other specified behavioral and emotional disorders with onset usually occurring in childhood and adolescence: Secondary | ICD-10-CM

## 2011-04-03 MED ORDER — AMPHETAMINE-DEXTROAMPHETAMINE 20 MG PO TABS: 20.0000 mg | ORAL_TABLET | Freq: Every day | ORAL | Status: DC

## 2011-04-03 NOTE — Telephone Encounter (Signed)
Called left message that prescription requested is ready for pickup at front desk. 

## 2011-04-03 NOTE — Telephone Encounter (Signed)
Pt called requesting refill of Adderall.  

## 2011-04-03 NOTE — Telephone Encounter (Signed)
Done hardcopy to robin  

## 2011-05-03 ENCOUNTER — Other Ambulatory Visit: Payer: Self-pay

## 2011-05-03 DIAGNOSIS — F988 Other specified behavioral and emotional disorders with onset usually occurring in childhood and adolescence: Secondary | ICD-10-CM

## 2011-05-03 MED ORDER — ALPRAZOLAM 0.5 MG PO TABS: ORAL_TABLET | ORAL | Status: DC

## 2011-05-03 MED ORDER — AMPHETAMINE-DEXTROAMPHETAMINE 20 MG PO TABS: 20.0000 mg | ORAL_TABLET | Freq: Every day | ORAL | Status: DC

## 2011-05-03 NOTE — Telephone Encounter (Signed)
Patient informed to pickup prescriptions at front desk. 

## 2011-05-03 NOTE — Telephone Encounter (Signed)
Done hardcopy to robin  

## 2011-05-31 ENCOUNTER — Other Ambulatory Visit: Payer: Self-pay

## 2011-05-31 DIAGNOSIS — F988 Other specified behavioral and emotional disorders with onset usually occurring in childhood and adolescence: Secondary | ICD-10-CM

## 2011-05-31 MED ORDER — AMPHETAMINE-DEXTROAMPHETAMINE 20 MG PO TABS: 20.0000 mg | ORAL_TABLET | Freq: Every day | ORAL | Status: DC

## 2011-05-31 NOTE — Telephone Encounter (Signed)
Called left message that prescription requested is ready for pickup at the front desk

## 2011-05-31 NOTE — Telephone Encounter (Signed)
Done hardcopy to robin  

## 2011-07-02 ENCOUNTER — Other Ambulatory Visit: Payer: Self-pay

## 2011-07-02 DIAGNOSIS — F988 Other specified behavioral and emotional disorders with onset usually occurring in childhood and adolescence: Secondary | ICD-10-CM

## 2011-07-02 MED ORDER — AMPHETAMINE-DEXTROAMPHETAMINE 20 MG PO TABS: 20.0000 mg | ORAL_TABLET | Freq: Every day | ORAL | Status: DC

## 2011-07-02 NOTE — Telephone Encounter (Signed)
Done hardcopy to robin  

## 2011-07-02 NOTE — Telephone Encounter (Signed)
Called the patient (Left Message) informed that prescription requested is ready for pickup at the front desk.

## 2011-07-30 ENCOUNTER — Other Ambulatory Visit: Payer: Self-pay | Admitting: Internal Medicine

## 2011-07-30 DIAGNOSIS — F988 Other specified behavioral and emotional disorders with onset usually occurring in childhood and adolescence: Secondary | ICD-10-CM

## 2011-07-30 MED ORDER — ALPRAZOLAM 0.5 MG PO TABS: ORAL_TABLET | ORAL | Status: DC

## 2011-07-30 MED ORDER — AMPHETAMINE-DEXTROAMPHETAMINE 20 MG PO TABS: 20.0000 mg | ORAL_TABLET | Freq: Every day | ORAL | Status: DC

## 2011-07-30 NOTE — Telephone Encounter (Signed)
Pt requesting 20 mg adderall and .5 xanax---pt will pick up these prescription--pt ph# [phone]

## 2011-07-30 NOTE — Telephone Encounter (Signed)
Done hardcopy to robin  Needs OV for further refills, last seen may 2012

## 2011-07-30 NOTE — Telephone Encounter (Signed)
Patient informed prescription are ready for pickup at the front desk. Also informed to schedule an OV in order to get more refills. The patient stated she would do so when she picks prescriptions up.

## 2011-08-14 ENCOUNTER — Other Ambulatory Visit: Payer: Self-pay | Admitting: Internal Medicine

## 2011-08-31 ENCOUNTER — Telehealth: Payer: Self-pay

## 2011-08-31 DIAGNOSIS — F988 Other specified behavioral and emotional disorders with onset usually occurring in childhood and adolescence: Secondary | ICD-10-CM

## 2011-08-31 MED ORDER — ALPRAZOLAM 0.5 MG PO TABS: ORAL_TABLET | ORAL | Status: DC

## 2011-08-31 MED ORDER — AMPHETAMINE-DEXTROAMPHETAMINE 20 MG PO TABS: 20.0000 mg | ORAL_TABLET | Freq: Every day | ORAL | Status: DC

## 2011-08-31 NOTE — Telephone Encounter (Signed)
Done hardcopy to robin  

## 2011-08-31 NOTE — Telephone Encounter (Signed)
Called the patient left detailed message that prescriptions requested are ready for pickup at the front desk. 

## 2011-08-31 NOTE — Telephone Encounter (Signed)
Pt called requesting refill of Adderall and Xanax. Pt has appt scheduled for 09/05/2011, please advise.

## 2011-09-05 ENCOUNTER — Encounter: Payer: Self-pay | Admitting: Internal Medicine

## 2011-09-05 ENCOUNTER — Other Ambulatory Visit (INDEPENDENT_AMBULATORY_CARE_PROVIDER_SITE_OTHER): Payer: BC Managed Care – PPO

## 2011-09-05 ENCOUNTER — Ambulatory Visit (INDEPENDENT_AMBULATORY_CARE_PROVIDER_SITE_OTHER): Payer: BC Managed Care – PPO | Admitting: Internal Medicine

## 2011-09-05 VITALS — BP 100/64 | HR 78 | Temp 97.5°F | Ht 64.0 in | Wt 138.2 lb

## 2011-09-05 DIAGNOSIS — Z0000 Encounter for general adult medical examination without abnormal findings: Secondary | ICD-10-CM

## 2011-09-05 DIAGNOSIS — F988 Other specified behavioral and emotional disorders with onset usually occurring in childhood and adolescence: Secondary | ICD-10-CM

## 2011-09-05 DIAGNOSIS — F411 Generalized anxiety disorder: Secondary | ICD-10-CM

## 2011-09-05 LAB — LIPID PANEL
Cholesterol: 146 mg/dL (ref 0–200)
HDL: 72.3 mg/dL (ref >39.00)
LDL Cholesterol: 64 mg/dL (ref 0–99)
Total CHOL/HDL Ratio: 2
Triglycerides: 48 mg/dL (ref 0.0–149.0)
VLDL: 9.6 mg/dL (ref 0.0–40.0)

## 2011-09-05 LAB — URINALYSIS, ROUTINE W REFLEX MICROSCOPIC
Bilirubin Urine: NEGATIVE
Hgb urine dipstick: NEGATIVE
Ketones, ur: NEGATIVE
Leukocytes, UA: NEGATIVE
Nitrite: NEGATIVE
Specific Gravity, Urine: <=1.005 (ref 1.000–1.030)
Total Protein, Urine: NEGATIVE
Urine Glucose: NEGATIVE
Urobilinogen, UA: 0.2 (ref 0.0–1.0)
pH: 6 (ref 5.0–8.0)

## 2011-09-05 LAB — HEPATIC FUNCTION PANEL
ALT: 13 U/L (ref 0–35)
AST: 15 U/L (ref 0–37)
Albumin: 4.5 g/dL (ref 3.5–5.2)
Alkaline Phosphatase: 38 U/L — ABNORMAL LOW (ref 39–117)
Bilirubin, Direct: 0.2 mg/dL (ref 0.0–0.3)
Total Bilirubin: 0.9 mg/dL (ref 0.3–1.2)
Total Protein: 7.3 g/dL (ref 6.0–8.3)

## 2011-09-05 LAB — CBC WITH DIFFERENTIAL/PLATELET
Basophils Absolute: 0 10*3/uL (ref 0.0–0.1)
Basophils Relative: 0.2 % (ref 0.0–3.0)
Eosinophils Absolute: 0.1 10*3/uL (ref 0.0–0.7)
Eosinophils Relative: 1 % (ref 0.0–5.0)
HCT: 39.7 % (ref 36.0–46.0)
Hemoglobin: 13.3 g/dL (ref 12.0–15.0)
Lymphocytes Relative: 23.5 % (ref 12.0–46.0)
Lymphs Abs: 1.7 10*3/uL (ref 0.7–4.0)
MCHC: 33.6 g/dL (ref 30.0–36.0)
MCV: 96.9 fl (ref 78.0–100.0)
Monocytes Absolute: 0.6 10*3/uL (ref 0.1–1.0)
Monocytes Relative: 7.5 % (ref 3.0–12.0)
Neutro Abs: 5 10*3/uL (ref 1.4–7.7)
Neutrophils Relative %: 67.8 % (ref 43.0–77.0)
Platelets: 256 10*3/uL (ref 150.0–400.0)
RBC: 4.1 Mil/uL (ref 3.87–5.11)
RDW: 13.7 % (ref 11.5–14.6)
WBC: 7.4 10*3/uL (ref 4.5–10.5)

## 2011-09-05 LAB — TSH: TSH: 0.69 u[IU]/mL (ref 0.35–5.50)

## 2011-09-05 LAB — BASIC METABOLIC PANEL
BUN: 11 mg/dL (ref 6–23)
CO2: 27 mEq/L (ref 19–32)
Calcium: 9.2 mg/dL (ref 8.4–10.5)
Chloride: 101 mEq/L (ref 96–112)
Creatinine, Ser: 0.8 mg/dL (ref 0.4–1.2)
GFR: 92.99 mL/min (ref >60.00)
Glucose, Bld: 88 mg/dL (ref 70–99)
Potassium: 3.6 mEq/L (ref 3.5–5.1)
Sodium: 137 mEq/L (ref 135–145)

## 2011-09-05 MED ORDER — AMPHETAMINE-DEXTROAMPHETAMINE 20 MG PO TABS: 20.0000 mg | ORAL_TABLET | Freq: Every day | ORAL | Status: DC

## 2011-09-05 MED ORDER — ALPRAZOLAM 0.5 MG PO TABS: ORAL_TABLET | ORAL | Status: DC

## 2011-09-05 NOTE — Assessment & Plan Note (Signed)
stable overall by hx and exam, most recent data reviewed with pt, and pt to continue medical treatment as before Lab Results  Component Value Date   WBC 7.4 09/05/2011   HGB 13.3 09/05/2011   HCT 39.7 09/05/2011   PLT 256.0 09/05/2011   GLUCOSE 88 09/05/2011   CHOL 146 09/05/2011   TRIG 48.0 09/05/2011   HDL 72.30 09/05/2011   LDLCALC 64 09/05/2011   ALT 13 09/05/2011   AST 15 09/05/2011   NA 137 09/05/2011   K 3.6 09/05/2011   CL 101 09/05/2011   CREATININE 0.8 09/05/2011   BUN 11 09/05/2011   CO2 27 09/05/2011   TSH 0.69 09/05/2011

## 2011-09-05 NOTE — Patient Instructions (Addendum)
Continue all other medications as before Please have the pharmacy call with any refills you may need. Please go to LAB in the Basement for the blood and/or urine tests to be done today You will be contacted by phone if any changes need to be made immediately.  Otherwise, you will receive a letter about your results with an explanation. Please return in 1 year for your yearly visit, or sooner if needed, with Lab testing done 3-5 days before

## 2011-09-05 NOTE — Assessment & Plan Note (Signed)

## 2011-09-05 NOTE — Progress Notes (Signed)
Subjective:    Patient ID: Dana Cervantes, female    DOB: 05/24/1980, 31 y.o.   MRN: [id number]  HPI  Here for wellness and f/u;  Overall doing ok;  Pt denies CP, worsening SOB, DOE, wheezing, orthopnea, PND, worsening LE edema, palpitations, dizziness or syncope.  Pt denies neurological change such as new Headache, facial or extremity weakness.  Pt denies polydipsia, polyuria, or low sugar symptoms. Pt states overall good compliance with treatment and medications, good tolerability, and trying to follow lower cholesterol diet.  Pt denies worsening depressive symptoms, suicidal ideation or panic. No fever, wt loss, night sweats, loss of appetite, or other constitutional symptoms.  Pt states good ability with ADL's, low fall risk, home safety reviewed and adequate, no significant changes in hearing or vision, and occasionally active with exercise.  Has been increased from 1 6 mo to 1 yr pap f/u after 2010 pap with [facility]IN-1.   Needs med refills today.  ADD meds working well.  Denies worsening depressive symptoms, suicidal ideation, or panic, though has ongoing anxiety. Past Medical History  Diagnosis Date  . HYPERLIPIDEMIA 06/03/2008  . ANEMIA-IRON DEFICIENCY 06/03/2008  . ANXIETY 11/09/2006  . DEPRESSION 11/09/2006  . ADD 11/09/2006  . MIGRAINE HEADACHE 11/09/2006  . ALLERGIC RHINITIS 12/08/2008  . ECZEMA 06/02/2009   Past Surgical History  Procedure Date  . S/p iud   . Tonsillectomy     reports that she has never smoked. She does not have any smokeless tobacco history on file. She reports that she drinks alcohol. Her drug history not on file. family history includes Cancer in her father and Hyperlipidemia in her father. No Known Allergies Current Outpatient Prescriptions on File Prior to Visit  Medication Sig Dispense Refill  . fluticasone (FLONASE) 50 MCG/ACT nasal spray INSTILL 2 SPRAYS IN EACH NOSTRIL DAILY  16 g  0  . DISCONTD: amphetamine-dextroamphetamine (ADDERALL, 20MG ,) 20 MG tablet Take 1  tablet (20 mg total) by mouth daily.  30 tablet  0   Review of Systems Review of Systems  Constitutional: Negative for diaphoresis, activity change, appetite change and unexpected weight change.  HENT: Negative for hearing loss, ear pain, facial swelling, mouth sores and neck stiffness.   Eyes: Negative for pain, redness and visual disturbance.  Respiratory: Negative for shortness of breath and wheezing.   Cardiovascular: Negative for chest pain and palpitations.  Gastrointestinal: Negative for diarrhea, blood in stool, abdominal distention and rectal pain.  Genitourinary: Negative for hematuria, flank pain and decreased urine volume.  Musculoskeletal: Negative for myalgias and joint swelling.  Skin: Negative for color change and wound.  Neurological: Negative for syncope and numbness.  Hematological: Negative for adenopathy.  Psychiatric/Behavioral: Negative for hallucinations, self-injury, decreased concentration and agitation.      Objective:   Physical Exam BP 100/64  Pulse 78  Temp 97.5 F (36.4 C) (Oral)  Ht 5\' 4"  (1.626 m)  Wt 138 lb 4 oz (62.71 kg)  BMI 23.73 kg/m2  SpO2 97% Physical Exam  VS noted Constitutional: Pt is oriented to person, place, and time. Appears well-developed and well-nourished.  HENT:  Head: Normocephalic and atraumatic.  Right Ear: External ear normal.  Left Ear: External ear normal.  Nose: Nose normal.  Mouth/Throat: Oropharynx is clear and moist.  Eyes: Conjunctivae and EOM are normal. Pupils are equal, round, and reactive to light.  Neck: Normal range of motion. Neck supple. No JVD present. No tracheal deviation present.  Cardiovascular: Normal rate, regular rhythm, normal heart sounds and  intact distal pulses.   Pulmonary/Chest: Effort normal and breath sounds normal.  Abdominal: Soft. Bowel sounds are normal. There is no tenderness.  Musculoskeletal: Normal range of motion. Exhibits no edema.  Lymphadenopathy:  Has no cervical adenopathy.    Neurological: Pt is alert and oriented to person, place, and time. Pt has normal reflexes. No cranial nerve deficit. Motor/sens/dtr/gait intact Skin: Skin is warm and dry. No rash noted.  Psychiatric:  Has  normal mood and affect. Behavior is normal.     Assessment & Plan:

## 2011-09-05 NOTE — Assessment & Plan Note (Signed)
stable overall by hx and exam, and pt to continue medical treatment as before 

## 2011-10-15 ENCOUNTER — Ambulatory Visit (INDEPENDENT_AMBULATORY_CARE_PROVIDER_SITE_OTHER)
Admission: RE | Admit: 2011-10-15 | Discharge: 2011-10-15 | Disposition: A | Discharge status: Home / Self Care | Payer: BC Managed Care – PPO | Source: Ambulatory Visit | Attending: Internal Medicine | Admitting: Internal Medicine

## 2011-10-15 ENCOUNTER — Ambulatory Visit (INDEPENDENT_AMBULATORY_CARE_PROVIDER_SITE_OTHER): Payer: BC Managed Care – PPO | Admitting: Internal Medicine

## 2011-10-15 ENCOUNTER — Encounter: Payer: Self-pay | Admitting: Internal Medicine

## 2011-10-15 VITALS — BP 118/78 | HR 96 | Temp 98.2°F | Resp 16 | Wt 135.8 lb

## 2011-10-15 DIAGNOSIS — S99919A Unspecified injury of unspecified ankle, initial encounter: Secondary | ICD-10-CM

## 2011-10-15 DIAGNOSIS — S8990XA Unspecified injury of unspecified lower leg, initial encounter: Secondary | ICD-10-CM

## 2011-10-15 DIAGNOSIS — S99912A Unspecified injury of left ankle, initial encounter: Secondary | ICD-10-CM

## 2011-10-15 NOTE — Progress Notes (Signed)
  Subjective:    Patient ID: Dana Cervantes, female    DOB: 1981-02-25, 31 y.o.   MRN: [id number]  HPI  She was moving some furniture last night and twisted her left ankle and today complains of pain and swelling over the left outer ankle. She took motrin and the pain resolved. She can bear weight on the LLE without difficulty.  Review of Systems  Constitutional: Negative.   HENT: Negative.   Eyes: Negative.   Respiratory: Negative.   Cardiovascular: Negative.   Gastrointestinal: Negative.   Genitourinary: Negative.   Musculoskeletal: Positive for arthralgias (left ankle). Negative for myalgias, back pain, joint swelling and gait problem.  Skin: Negative.   Neurological: Negative.   Hematological: Negative.   Psychiatric/Behavioral: Negative.        Objective:   Physical Exam  Musculoskeletal:       Left ankle: She exhibits swelling (very mild swelling over the lateral malleolus). She exhibits normal range of motion, no ecchymosis, no deformity, no laceration and normal pulse. no tenderness. No lateral malleolus and no medial malleolus tenderness found. Achilles tendon normal. Achilles tendon exhibits no pain, no defect and normal Thompson's test results.          Assessment & Plan:

## 2011-10-15 NOTE — Patient Instructions (Signed)
Ankle Sprain An ankle sprain is an injury to the strong, fibrous tissues (ligaments) that hold the bones of your ankle joint together.  CAUSES Ankle sprain usually is caused by a fall or by twisting your ankle. People who participate in sports are more prone to these types of injuries.  SYMPTOMS  Symptoms of ankle sprain include:  Pain in your ankle. The pain may be present at rest or only when you are trying to stand or walk.   Swelling.   Bruising. Bruising may develop immediately or within 1 to 2 days after your injury.   Difficulty standing or walking.  DIAGNOSIS  Your caregiver will ask you details about your injury and perform a physical exam of your ankle to determine if you have an ankle sprain. During the physical exam, your caregiver will press and squeeze specific areas of your foot and ankle. Your caregiver will try to move your ankle in certain ways. An X-ray exam may be done to be sure a bone was not broken or a ligament did not separate from one of the bones in your ankle (avulsion).  TREATMENT  Certain types of braces can help stabilize your ankle. Your caregiver can make a recommendation for this. Your caregiver may recommend the use of medication for pain. If your sprain is severe, your caregiver may refer you to a surgeon who helps to restore function to parts of your skeletal system (orthopedist) or a physical therapist. HOME CARE INSTRUCTIONS  Apply ice to your injury for 1 to 2 days or as directed by your caregiver. Applying ice helps to reduce inflammation and pain.  Put ice in a plastic bag.   Place a towel between your skin and the bag.   Leave the ice on for 15 to 20 minutes at a time, every 2 hours while you are awake.   Take over-the-counter or prescription medicines for pain, discomfort, or fever only as directed by your caregiver.   Keep your injured leg elevated, when possible, to lessen swelling.   If your caregiver recommends crutches, use them as  instructed. Gradually, put weight on the affected ankle. Continue to use crutches or a cane until you can walk without feeling pain in your ankle.   If you have a plaster splint, wear the splint as directed by your caregiver. Do not rest it on anything harder than a pillow the first 24 hours. Do not put weight on it. Do not get it wet. You may take it off to take a shower or bath.   You may have been given an elastic bandage to wear around your ankle to provide support. If the elastic bandage is too tight (you have numbness or tingling in your foot or your foot becomes cold and blue), adjust the bandage to make it comfortable.   If you have an air splint, you may blow more air into it or let air out to make it more comfortable. You may take your splint off at night and before taking a shower or bath.   Wiggle your toes in the splint several times per day if you are able.  SEEK MEDICAL CARE IF:   You have an increase in bruising, swelling, or pain.   Your toes feel cold.   Pain relief is not achieved with medication.  SEEK IMMEDIATE MEDICAL CARE IF: Your toes are numb or blue or you have severe pain. MAKE SURE YOU:   Understand these instructions.   Will watch your condition.     Will get help right away if you are not doing well or get worse.  Document Released: 03/05/2005 Document Revised: 02/22/2011 Document Reviewed: 10/08/2007 [employer] Patient Information 2012 [employer]. 

## 2011-10-15 NOTE — Assessment & Plan Note (Signed)
I think she has a mild sprain, I will check a plain film to look for fracture, she will rest/ice/elevate and continue nsaids

## 2011-10-16 ENCOUNTER — Other Ambulatory Visit: Payer: Self-pay | Admitting: Internal Medicine

## 2012-02-19 ENCOUNTER — Other Ambulatory Visit: Payer: Self-pay | Admitting: Internal Medicine

## 2012-02-19 NOTE — Telephone Encounter (Signed)
Patient would like a couple months supply on her refill of Xanax as she has to travel to [facility]

## 2012-02-19 NOTE — Telephone Encounter (Signed)
Faxed hardcopy to pharmacy. 

## 2012-02-19 NOTE — Telephone Encounter (Signed)
Done hardcopy to robin  

## 2012-04-25 ENCOUNTER — Telehealth: Payer: Self-pay | Admitting: Internal Medicine

## 2012-04-25 DIAGNOSIS — F988 Other specified behavioral and emotional disorders with onset usually occurring in childhood and adolescence: Secondary | ICD-10-CM

## 2012-04-25 MED ORDER — AMPHETAMINE-DEXTROAMPHETAMINE 20 MG PO TABS: 20.0000 mg | ORAL_TABLET | Freq: Every day | ORAL | Status: DC

## 2012-04-25 NOTE — Telephone Encounter (Signed)
Called the patient informed to pickup hardcopy at the front desk. (Left detailed Message)

## 2012-04-25 NOTE — Telephone Encounter (Signed)
Patient requesting refill on 20mg  Adderall, call when ready

## 2012-04-25 NOTE — Telephone Encounter (Signed)
Done hardcopy to robin  

## 2012-07-31 ENCOUNTER — Other Ambulatory Visit: Payer: Self-pay

## 2012-07-31 DIAGNOSIS — F988 Other specified behavioral and emotional disorders with onset usually occurring in childhood and adolescence: Secondary | ICD-10-CM

## 2012-07-31 MED ORDER — AMPHETAMINE-DEXTROAMPHETAMINE 20 MG PO TABS: 20.0000 mg | ORAL_TABLET | Freq: Every day | ORAL | Status: DC

## 2012-07-31 NOTE — Telephone Encounter (Signed)
Done hardcopy to robin  

## 2012-07-31 NOTE — Telephone Encounter (Signed)
Pt informed rx can be picked up

## 2012-08-18 ENCOUNTER — Other Ambulatory Visit: Payer: Self-pay | Admitting: Internal Medicine

## 2012-08-18 NOTE — Telephone Encounter (Signed)
Faxed hardcopy to [facility] [place] 

## 2012-08-18 NOTE — Telephone Encounter (Signed)
Done hardcopy to robin  

## 2012-11-08 ENCOUNTER — Other Ambulatory Visit: Payer: Self-pay | Admitting: Internal Medicine

## 2012-11-20 ENCOUNTER — Telehealth: Payer: Self-pay | Admitting: *Deleted

## 2012-11-20 DIAGNOSIS — F988 Other specified behavioral and emotional disorders with onset usually occurring in childhood and adolescence: Secondary | ICD-10-CM

## 2012-11-20 MED ORDER — AMPHETAMINE-DEXTROAMPHETAMINE 20 MG PO TABS: 20.0000 mg | ORAL_TABLET | Freq: Every day | ORAL | Status: DC

## 2012-11-20 NOTE — Telephone Encounter (Signed)
Pt called requesting a Adderall 20mg  refill.  pts last OV 7.29.2013.  Please advise

## 2012-11-20 NOTE — Telephone Encounter (Signed)
Done hardcopy to robin  

## 2012-11-20 NOTE — Telephone Encounter (Signed)
Called the patient informed to pickup hardcopy at the front desk and to schedule ROV with Dr. Jonny Ruiz.  Patient agreed to do so.

## 2012-12-25 ENCOUNTER — Other Ambulatory Visit: Payer: Self-pay | Admitting: Internal Medicine

## 2012-12-28 ENCOUNTER — Other Ambulatory Visit: Payer: Self-pay | Admitting: Internal Medicine

## 2013-01-20 ENCOUNTER — Encounter: Payer: Self-pay | Admitting: Internal Medicine

## 2013-01-20 ENCOUNTER — Ambulatory Visit (INDEPENDENT_AMBULATORY_CARE_PROVIDER_SITE_OTHER): Payer: BC Managed Care – PPO | Admitting: Internal Medicine

## 2013-01-20 VITALS — BP 100/64 | HR 79 | Temp 97.6°F | Ht 66.0 in | Wt 134.1 lb

## 2013-01-20 DIAGNOSIS — Z0000 Encounter for general adult medical examination without abnormal findings: Secondary | ICD-10-CM

## 2013-01-20 DIAGNOSIS — F411 Generalized anxiety disorder: Secondary | ICD-10-CM

## 2013-01-20 DIAGNOSIS — F988 Other specified behavioral and emotional disorders with onset usually occurring in childhood and adolescence: Secondary | ICD-10-CM

## 2013-01-20 DIAGNOSIS — Z23 Encounter for immunization: Secondary | ICD-10-CM

## 2013-01-20 MED ORDER — ALPRAZOLAM 0.5 MG PO TABS: ORAL_TABLET | ORAL | Status: DC

## 2013-01-20 MED ORDER — AMPHETAMINE-DEXTROAMPHETAMINE 20 MG PO TABS: 20.0000 mg | ORAL_TABLET | Freq: Every day | ORAL | Status: DC

## 2013-01-20 MED ORDER — ESCITALOPRAM OXALATE 10 MG PO TABS: 10.0000 mg | ORAL_TABLET | Freq: Every day | ORAL | Status: DC

## 2013-01-20 MED ORDER — FLUTICASONE PROPIONATE 50 MCG/ACT NA SUSP: 2.0000 | Freq: Every day | NASAL | Status: DC

## 2013-01-20 NOTE — Assessment & Plan Note (Signed)
With increased stress, add lexapro 10 qd, Please continue all other medications as before,

## 2013-01-20 NOTE — Assessment & Plan Note (Signed)
stable overall by history and exam, and pt to continue medical treatment as before,  to f/u any worsening symptoms or concerns, for med refills 

## 2013-01-20 NOTE — Progress Notes (Signed)
Pre-visit discussion using our clinic review tool. No additional management support is needed unless otherwise documented below in the visit note.  

## 2013-01-20 NOTE — Assessment & Plan Note (Signed)
Overall doing well, age appropriate education and counseling updated, referrals for preventative services and immunizations addressed, dietary and smoking counseling addressed, most recent labs reviewed.  I have personally reviewed and have noted: 1) the patient's medical and social history 2) The pt's use of alcohol, tobacco, and illicit drugs 3) The patient's current medications and supplements 4) Functional ability including ADL's, fall risk, home safety risk, hearing and visual impairment 5) Diet and physical activities 6) Evidence for depression or mood disorder 7) The patient's height, weight, and BMI have been recorded in the chart I have made referrals, and provided counseling and education based on review of the above Lab Results  Component Value Date   WBC 7.4 09/05/2011   HGB 13.3 09/05/2011   HCT 39.7 09/05/2011   PLT 256.0 09/05/2011   GLUCOSE 88 09/05/2011   CHOL 146 09/05/2011   TRIG 48.0 09/05/2011   HDL 72.30 09/05/2011   LDLCALC 64 09/05/2011   ALT 13 09/05/2011   AST 15 09/05/2011   NA 137 09/05/2011   K 3.6 09/05/2011   CL 101 09/05/2011   CREATININE 0.8 09/05/2011   BUN 11 09/05/2011   CO2 27 09/05/2011   TSH 0.69 09/05/2011   Declines furhter labs this yr

## 2013-01-20 NOTE — Patient Instructions (Signed)
Please take all new medication as prescribed - the lexapro (generic) at 10 mg per day (low dose) Please continue all other medications as before, and refills have been done if requested. Please have the pharmacy call with any other refills you may need. Please continue your efforts at being more active, low cholesterol diet, and weight control. You are otherwise up to date with prevention measures today. We can hold off on further labs today Please keep your appointments with your specialists as you have planned - GYN  Please remember to sign up for My Chart if you have not done so, as this will be important to you in the future with finding out test results, communicating by private email, and scheduling acute appointments online when needed.  Please return in 1 year for your yearly visit, or sooner if needed

## 2013-01-20 NOTE — Progress Notes (Signed)
Subjective:    Patient ID: Dana Cervantes, female    DOB: 1980/12/30, 30 y.o.   MRN: [id number]  HPI Here for wellness and f/u;  Overall doing ok;  Pt denies CP, worsening SOB, DOE, wheezing, orthopnea, PND, worsening LE edema, palpitations, dizziness or syncope.  Pt denies neurological change such as new headache, facial or extremity weakness.  Pt denies polydipsia, polyuria, or low sugar symptoms. Pt states overall good compliance with treatment and medications, good tolerability, and has been trying to follow lower cholesterol diet.  Pt denies worsening depressive symptoms, suicidal ideation or panic. No fever, night sweats, wt loss, loss of appetite, or other constitutional symptoms.  Pt states good ability with ADL's, has low fall risk, home safety reviewed and adequate, no other significant changes in hearing or vision, and only occasionally active with exercise. No acute complaints. 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 HYPERLIPIDEMIA 06/03/2008  . ANEMIA-IRON DEFICIENCY 06/03/2008  . ANXIETY 11/09/2006  . DEPRESSION 11/09/2006  . ADD 11/09/2006  . MIGRAINE HEADACHE 11/09/2006  . ALLERGIC RHINITIS 12/08/2008  . ECZEMA 06/02/2009   Past Surgical History  Procedure Laterality Date  . S/p iud    . Tonsillectomy      reports that she has never smoked. She does not have any smokeless tobacco history on file. She reports that she does not drink alcohol or use illicit drugs. family history includes Cancer in her father; Hyperlipidemia in her father. No Known Allergies Current Outpatient Prescriptions on File Prior to Visit  Medication Sig Dispense Refill  . ALPRAZolam (XANAX) 0.5 MG tablet TAKE 1/2 TO 1 TABLET BY MOUTH DAILY AS NEEDED FOR NERVES  30 tablet  5  . amphetamine-dextroamphetamine (ADDERALL) 20 MG tablet Take 1 tablet (20 mg total) by mouth daily.  30 tablet  0    No current facility-administered medications on file prior to visit.   Review of Systems Constitutional: Negative for diaphoresis, activity change, appetite change or unexpected weight change.  HENT: Negative for hearing loss, ear pain, facial swelling, mouth sores and neck stiffness.   Eyes: Negative for pain, redness and visual disturbance.  Respiratory: Negative for shortness of breath and wheezing.   Cardiovascular: Negative for chest pain and palpitations.  Gastrointestinal: Negative for diarrhea, blood in stool, abdominal distention or other pain Genitourinary: Negative for hematuria, flank pain or change in urine volume.  Musculoskeletal: Negative for myalgias and joint swelling.  Skin: Negative for color change and wound.  Neurological: Negative for syncope and numbness. other than noted Hematological: Negative for adenopathy.  Psychiatric/Behavioral: Negative for hallucinations, self-injury, decreased concentration and agitation.      Objective:   Physical Exam BP 100/64  Pulse 79  Temp(Src) 97.6 F (36.4 C) (Oral)  Ht 5\' 6"  (1.676 m)  Wt 134 lb 2 oz (60.839 kg)  BMI 21.66 kg/m2  SpO2 99% VS noted,  Constitutional: Pt is oriented to person, place, and time. Appears well-developed and well-nourished.  Head: Normocephalic and atraumatic.  Right Ear: External ear normal.  Left Ear: External ear normal.  Nose: Nose normal.  Mouth/Throat: Oropharynx is clear and moist.  Eyes: Conjunctivae and EOM are normal. Pupils are equal, round, and reactive to light.  Neck: Normal range of motion. Neck supple. No JVD present. No tracheal deviation present.  Cardiovascular: Normal rate, regular rhythm, normal heart sounds and intact distal pulses.  Pulmonary/Chest: Effort normal and breath sounds normal.  Abdominal: Soft. Bowel sounds are normal. There is no tenderness. No HSM  Musculoskeletal: Normal range of motion. Exhibits no edema.  Lymphadenopathy:  Has no cervical  adenopathy.  Neurological: Pt is alert and oriented to person, place, and time. Pt has normal reflexes. No cranial nerve deficit.  Skin: Skin is warm and dry. No rash noted.  Psychiatric:  Has  normal mood and affect. Behavior is normal.     Assessment & Plan:

## 2013-07-14 ENCOUNTER — Telehealth: Payer: Self-pay | Admitting: *Deleted

## 2013-07-14 MED ORDER — ALPRAZOLAM 0.5 MG PO TABS: ORAL_TABLET | ORAL | Status: DC

## 2013-07-14 NOTE — Telephone Encounter (Signed)
[facility] [place]

## 2013-07-14 NOTE — Telephone Encounter (Signed)
Done hardcopy to robin  

## 2013-07-14 NOTE — Telephone Encounter (Signed)
Pt called requesting Alprazolam refill.  Last OV 11.4.14.  Please advise

## 2013-07-21 ENCOUNTER — Telehealth: Payer: Self-pay | Admitting: *Deleted

## 2013-07-21 NOTE — Telephone Encounter (Signed)
Spoke with pt advised as per phone note, [facility] [place]

## 2013-07-21 NOTE — Telephone Encounter (Signed)
Pt called requesting Alprazolam refill.  Please advise 

## 2013-07-21 NOTE — Telephone Encounter (Signed)
Already done may 1

## 2013-10-07 ENCOUNTER — Telehealth: Payer: Self-pay

## 2013-10-07 DIAGNOSIS — F988 Other specified behavioral and emotional disorders with onset usually occurring in childhood and adolescence: Secondary | ICD-10-CM

## 2013-10-07 MED ORDER — AMPHETAMINE-DEXTROAMPHETAMINE 20 MG PO TABS: 20.0000 mg | ORAL_TABLET | Freq: Every day | ORAL | Status: DC

## 2013-10-07 NOTE — Telephone Encounter (Signed)
Called the patient left a detailed message hardcopy is ready for pickup at the front desk. 

## 2013-10-07 NOTE — Telephone Encounter (Signed)
Done hardcopy to robin  

## 2013-10-07 NOTE — Telephone Encounter (Signed)
Pt requesting refill on adderall 20 mg 1 tab po daily qty 30.  Pt has not been seen since 01/20/2013.  Pt does have an upcoming appt for 01/27/2014. Do you want to refill this med?

## 2013-12-14 ENCOUNTER — Telehealth: Payer: Self-pay | Admitting: Internal Medicine

## 2013-12-14 DIAGNOSIS — F988 Other specified behavioral and emotional disorders with onset usually occurring in childhood and adolescence: Secondary | ICD-10-CM

## 2013-12-14 NOTE — Telephone Encounter (Signed)
Pt called in looking for a refill for amphetamine-dextroamphetamine (ADDERALL) 20 MG tablet [[id number]]      Thank you

## 2013-12-14 NOTE — Telephone Encounter (Signed)
Can hold until MD return tomorrow...Dana Cervantes

## 2013-12-15 MED ORDER — AMPHETAMINE-DEXTROAMPHETAMINE 20 MG PO TABS: 20.0000 mg | ORAL_TABLET | Freq: Every day | ORAL | Status: DC

## 2013-12-15 NOTE — Telephone Encounter (Signed)
Done hardcopy to robin  

## 2013-12-16 ENCOUNTER — Telehealth: Payer: Self-pay | Admitting: Internal Medicine

## 2013-12-16 NOTE — Telephone Encounter (Signed)
Called the patient once again as did this am that hardcopy has been filled and is in the cabnet at the front desk.

## 2013-12-16 NOTE — Telephone Encounter (Signed)
Called the patient informed to pickup hardcopy at the front desk.  Had to LEAVE A DETAILED MESSAGE ON VOICE MAIL.

## 2013-12-16 NOTE — Telephone Encounter (Signed)
Requesting refill on adderall

## 2013-12-16 NOTE — Telephone Encounter (Signed)
Already done sept 29

## 2013-12-16 NOTE — Telephone Encounter (Signed)
Patient is requesting refill on adderall.   °

## 2014-01-13 ENCOUNTER — Other Ambulatory Visit: Payer: Self-pay | Admitting: Internal Medicine

## 2014-01-13 NOTE — Telephone Encounter (Signed)
Done hardcopy to robin  

## 2014-01-13 NOTE — Telephone Encounter (Signed)
Faxed hardcopy [facility]am to [facility] [place]

## 2014-01-27 ENCOUNTER — Encounter: Payer: Self-pay | Admitting: Internal Medicine

## 2014-01-27 ENCOUNTER — Ambulatory Visit (INDEPENDENT_AMBULATORY_CARE_PROVIDER_SITE_OTHER): Payer: BC Managed Care – PPO | Admitting: Internal Medicine

## 2014-01-27 ENCOUNTER — Other Ambulatory Visit (INDEPENDENT_AMBULATORY_CARE_PROVIDER_SITE_OTHER): Payer: BC Managed Care – PPO

## 2014-01-27 VITALS — BP 102/72 | HR 90 | Temp 98.3°F | Wt 142.8 lb

## 2014-01-27 DIAGNOSIS — E049 Nontoxic goiter, unspecified: Secondary | ICD-10-CM

## 2014-01-27 DIAGNOSIS — F988 Other specified behavioral and emotional disorders with onset usually occurring in childhood and adolescence: Secondary | ICD-10-CM

## 2014-01-27 DIAGNOSIS — Z23 Encounter for immunization: Secondary | ICD-10-CM

## 2014-01-27 DIAGNOSIS — Z0000 Encounter for general adult medical examination without abnormal findings: Secondary | ICD-10-CM

## 2014-01-27 DIAGNOSIS — F411 Generalized anxiety disorder: Secondary | ICD-10-CM

## 2014-01-27 DIAGNOSIS — F909 Attention-deficit hyperactivity disorder, unspecified type: Secondary | ICD-10-CM

## 2014-01-27 DIAGNOSIS — L309 Dermatitis, unspecified: Secondary | ICD-10-CM

## 2014-01-27 LAB — BASIC METABOLIC PANEL
BUN: 15 mg/dL (ref 6–23)
CO2: 28 mEq/L (ref 19–32)
Calcium: 9.4 mg/dL (ref 8.4–10.5)
Chloride: 102 mEq/L (ref 96–112)
Creatinine, Ser: 1.4 mg/dL — ABNORMAL HIGH (ref 0.4–1.2)
GFR: 46.97 mL/min — ABNORMAL LOW (ref >60.00)
Glucose, Bld: 94 mg/dL (ref 70–99)
Potassium: 4.2 mEq/L (ref 3.5–5.1)
Sodium: 135 mEq/L (ref 135–145)

## 2014-01-27 LAB — LIPID PANEL
Cholesterol: 158 mg/dL (ref 0–200)
HDL: 53.3 mg/dL (ref >39.00)
LDL Cholesterol: 82 mg/dL (ref 0–99)
NonHDL: 104.7
Total CHOL/HDL Ratio: 3
Triglycerides: 112 mg/dL (ref 0.0–149.0)
VLDL: 22.4 mg/dL (ref 0.0–40.0)

## 2014-01-27 LAB — HEPATIC FUNCTION PANEL
ALT: 16 U/L (ref 0–35)
AST: 14 U/L (ref 0–37)
Albumin: 3.9 g/dL (ref 3.5–5.2)
Alkaline Phosphatase: 35 U/L — ABNORMAL LOW (ref 39–117)
Bilirubin, Direct: 0.2 mg/dL (ref 0.0–0.3)
Total Bilirubin: 1.1 mg/dL (ref 0.2–1.2)
Total Protein: 7.3 g/dL (ref 6.0–8.3)

## 2014-01-27 LAB — URINALYSIS, ROUTINE W REFLEX MICROSCOPIC
Bilirubin Urine: NEGATIVE
Hgb urine dipstick: NEGATIVE
Leukocytes, UA: NEGATIVE
Nitrite: NEGATIVE
RBC / HPF: NONE SEEN (ref 0–?)
Specific Gravity, Urine: 1.015 (ref 1.000–1.030)
Total Protein, Urine: NEGATIVE
Urine Glucose: NEGATIVE
Urobilinogen, UA: 0.2 (ref 0.0–1.0)
WBC, UA: NONE SEEN (ref 0–?)
pH: 7 (ref 5.0–8.0)

## 2014-01-27 LAB — CBC WITH DIFFERENTIAL/PLATELET
Basophils Absolute: 0 10*3/uL (ref 0.0–0.1)
Basophils Relative: 0.3 % (ref 0.0–3.0)
Eosinophils Absolute: 0.2 10*3/uL (ref 0.0–0.7)
Eosinophils Relative: 2.3 % (ref 0.0–5.0)
HCT: 43 % (ref 36.0–46.0)
Hemoglobin: 14.5 g/dL (ref 12.0–15.0)
Lymphocytes Relative: 22.9 % (ref 12.0–46.0)
Lymphs Abs: 1.7 10*3/uL (ref 0.7–4.0)
MCHC: 33.8 g/dL (ref 30.0–36.0)
MCV: 96.7 fl (ref 78.0–100.0)
Monocytes Absolute: 0.6 10*3/uL (ref 0.1–1.0)
Monocytes Relative: 8.3 % (ref 3.0–12.0)
Neutro Abs: 4.8 10*3/uL (ref 1.4–7.7)
Neutrophils Relative %: 66.2 % (ref 43.0–77.0)
Platelets: 238 10*3/uL (ref 150.0–400.0)
RBC: 4.45 Mil/uL (ref 3.87–5.11)
RDW: 12.7 % (ref 11.5–15.5)
WBC: 7.3 10*3/uL (ref 4.0–10.5)

## 2014-01-27 LAB — T4, FREE: Free T4: 0.88 ng/dL (ref 0.60–1.60)

## 2014-01-27 LAB — TSH: TSH: 0.52 u[IU]/mL (ref 0.35–4.50)

## 2014-01-27 MED ORDER — ALPRAZOLAM 0.5 MG PO TABS: ORAL_TABLET | ORAL | Status: DC

## 2014-01-27 MED ORDER — AMPHETAMINE-DEXTROAMPHETAMINE 20 MG PO TABS: 20.0000 mg | ORAL_TABLET | Freq: Every day | ORAL | Status: DC

## 2014-01-27 MED ORDER — TRIAMCINOLONE ACETONIDE 0.1 % EX CREA: 1.0000 "application " | TOPICAL_CREAM | Freq: Two times a day (BID) | CUTANEOUS | Status: DC

## 2014-01-27 MED ORDER — FLUTICASONE PROPIONATE 50 MCG/ACT NA SUSP: 2.0000 | Freq: Every day | NASAL | Status: DC

## 2014-01-27 NOTE — Assessment & Plan Note (Signed)
Mild, for triam cr prn,  to f/u any worsening symptoms or concerns 

## 2014-01-27 NOTE — Progress Notes (Signed)
Pre visit review using our clinic review tool, if applicable. No additional management support is needed unless otherwise documented below in the visit note. 

## 2014-01-27 NOTE — Assessment & Plan Note (Signed)
Last eval in her late teens, saw endo, for f/u u/s now, TSH/free t4 with labs

## 2014-01-27 NOTE — Assessment & Plan Note (Signed)

## 2014-01-27 NOTE — Progress Notes (Signed)
Subjective:    Patient ID: Dana Cervantes, female    DOB: 07/10/1980, 33 y.o.   MRN: [id number]  HPI  Here for wellness and f/u;  Overall doing ok;  Pt denies CP, worsening SOB, DOE, wheezing, orthopnea, PND, worsening LE edema, palpitations, dizziness or syncope.  Pt denies neurological change such as new headache, facial or extremity weakness.  Pt denies polydipsia, polyuria, or low sugar symptoms. Pt states overall good compliance with treatment and medications, good tolerability, and has been trying to follow lower cholesterol diet.  Pt denies worsening depressive symptoms, suicidal ideation or panic. No fever, night sweats, wt loss, loss of appetite, or other constitutional symptoms.  Pt states good ability with ADL's, has low fall risk, home safety reviewed and adequate, no other significant changes in hearing or vision, and only occasionally active with exercise.  Did have mild URI a few wks ago x 3 days, resolved.  No current compalints.  Does feel stressed on occasion.  Has 1 biological child 6yo, but adopted 3 others by marriage at 4315 and below.  Denies hyper or hypo thyroid symptoms such as voice, skin or hair change.  She is unaware of any enlargement of the thryoid, has been ongoing issue since late teens.  Nasal spray working well for allergy symtpoms.  Has mild skin eczema noted right palm Past Medical History  Diagnosis Date  . HYPERLIPIDEMIA 06/03/2008  . ANEMIA-IRON DEFICIENCY 06/03/2008  . ANXIETY 11/09/2006  . DEPRESSION 11/09/2006  . ADD 11/09/2006  . MIGRAINE HEADACHE 11/09/2006  . ALLERGIC RHINITIS 12/08/2008  . ECZEMA 06/02/2009   Past Surgical History  Procedure Laterality Date  . S/p iud    . Tonsillectomy      reports that she has never smoked. She does not have any smokeless tobacco history on file. She reports that she does not drink alcohol or use illicit drugs. family history includes Cancer in her father; Hyperlipidemia in her father. No Known Allergies Current  Outpatient Prescriptions on File Prior to Visit  Medication Sig Dispense Refill  . ALPRAZolam (XANAX) 0.5 MG tablet TAKE 1/2-1 TABLE BY MOUTH DAILY AS NEEDED FOR NERVES*CAN FILL MAY 1ST PER DOCTOR* 30 tablet 0  . amphetamine-dextroamphetamine (ADDERALL) 20 MG tablet Take 1 tablet (20 mg total) by mouth daily. 30 tablet 0  . fluticasone (FLONASE) 50 MCG/ACT nasal spray Place 2 sprays into both nostrils daily. 48 g 3  . escitalopram (LEXAPRO) 10 MG tablet Take 1 tablet (10 mg total) by mouth daily. 90 tablet 3   No current facility-administered medications on file prior to visit.   Review of Systems Constitutional: Negative for increased diaphoresis, other activity, appetite or other siginficant weight change  HENT: Negative for worsening hearing loss, ear pain, facial swelling, mouth sores and neck stiffness.   Eyes: Negative for other worsening pain, redness or visual disturbance.  Respiratory: Negative for shortness of breath and wheezing.   Cardiovascular: Negative for chest pain and palpitations.  Gastrointestinal: Negative for diarrhea, blood in stool, abdominal distention or other pain Genitourinary: Negative for hematuria, flank pain or change in urine volume.  Musculoskeletal: Negative for myalgias or other joint complaints.  Skin: Negative for color change and wound.  Neurological: Negative for syncope and numbness. other than noted Hematological: Negative for adenopathy. or other swelling Psychiatric/Behavioral: Negative for hallucinations, self-injury, decreased concentration or other worsening agitation.      Objective:   Physical Exam BP 102/72 mmHg  Pulse 90  Temp(Src) 98.3 F (36.8 C) (Oral)  Wt 142 lb 12 oz (64.751 kg)  SpO2 98% VS noted,  Constitutional: Pt is oriented to person, place, and time. Appears well-developed and well-nourished.  Head: Normocephalic and atraumatic.  Right Ear: External ear normal.  Left Ear: External ear normal.  Nose: Nose normal.    Mouth/Throat: Oropharynx is clear and moist.  Eyes: Conjunctivae and EOM are normal. Pupils are equal, round, and reactive to light.  Neck: Normal range of motion. Neck supple. No JVD present. No tracheal deviation present.  thyroid 1-2+ diffusely enlarged, NT Cardiovascular: Normal rate, regular rhythm, normal heart sounds and intact distal pulses.   Pulmonary/Chest: Effort normal and breath sounds without rales or wheezing  Abdominal: Soft. Bowel sounds are normal. NT. No HSM  Musculoskeletal: Normal range of motion. Exhibits no edema.  Lymphadenopathy:  Has no cervical adenopathy.  Neurological: Pt is alert and oriented to person, place, and time. Pt has normal reflexes. No cranial nerve deficit. Motor grossly intact Skin: Skin is warm and dry. No rash noted. exceot for eczema rash to right > left palms Psychiatric:  Has mild nervous mood and affect. Behavior is normal.  Wt Readings from Last 3 Encounters:  01/27/14 142 lb 12 oz (64.751 kg)  01/20/13 134 lb 2 oz (60.839 kg)  10/15/11 135 lb 12 oz (61.576 kg)       Assessment & Plan:

## 2014-01-27 NOTE — Patient Instructions (Addendum)
You had the flu shot today  Please take all new medication as prescribed - the cream for the eczema  Please continue all other medications as before, and refills have been done if requested - the adderall, and the xanax (all others went to pharmacy)  Please have the pharmacy call with any other refills you may need.  Please continue your efforts at being more active, low cholesterol diet, and weight control.  You are otherwise up to date with prevention measures today.  Please keep your appointments with your specialists as you may have planned  You will be contacted regarding the referral for: thyorid ultrasound  Please go to the LAB in the Basement (turn left off the elevator) for the tests to be done today  You will be contacted by phone if any changes need to be made immediately.  Otherwise, you will receive a letter about your results with an explanation, but please check with MyChart first.  Please remember to sign up for MyChart if you have not done so, as this will be important to you in the future with finding out test results, communicating by private email, and scheduling acute appointments online when needed.  Please return in 1 year for your yearly visit, or sooner if needed, with Lab testing done 3-5 days before

## 2014-01-27 NOTE — Assessment & Plan Note (Signed)
stable overall by history and exam, recent data reviewed with pt, and pt to continue medical treatment as before,  to f/u any worsening symptoms or concerns, for f/u refills today

## 2014-01-27 NOTE — Assessment & Plan Note (Signed)
stable overall by history and exam, recent data reviewed with pt, and pt to continue medical treatment as before,  to f/u any worsening symptoms or concerns Lab Results  Component Value Date   WBC 7.4 09/05/2011   HGB 13.3 09/05/2011   HCT 39.7 09/05/2011   PLT 256.0 09/05/2011   GLUCOSE 88 09/05/2011   CHOL 146 09/05/2011   TRIG 48.0 09/05/2011   HDL 72.30 09/05/2011   LDLCALC 64 09/05/2011   ALT 13 09/05/2011   AST 15 09/05/2011   NA 137 09/05/2011   K 3.6 09/05/2011   CL 101 09/05/2011   CREATININE 0.8 09/05/2011   BUN 11 09/05/2011   CO2 27 09/05/2011   TSH 0.69 09/05/2011   For f/u med refills

## 2014-02-01 ENCOUNTER — Other Ambulatory Visit: Payer: BC Managed Care – PPO

## 2014-07-26 ENCOUNTER — Telehealth: Payer: Self-pay | Admitting: Internal Medicine

## 2014-07-26 DIAGNOSIS — F988 Other specified behavioral and emotional disorders with onset usually occurring in childhood and adolescence: Secondary | ICD-10-CM

## 2014-07-26 NOTE — Telephone Encounter (Signed)
Patient requesting ALPRAZolam (XANAX) 0.5 MG tablet [[id number] and amphetamine-dextroamphetamine (ADDERALL) 20 MG tablet [[id number]][[id number]]

## 2014-07-26 NOTE — Telephone Encounter (Signed)
MD is out of office will hold until return tomorrow...[name]/lmb

## 2014-07-27 MED ORDER — AMPHETAMINE-DEXTROAMPHETAMINE 20 MG PO TABS: 20.0000 mg | ORAL_TABLET | Freq: Every day | ORAL | Status: DC

## 2014-07-27 MED ORDER — ALPRAZOLAM 0.5 MG PO TABS: ORAL_TABLET | ORAL | Status: DC

## 2014-07-27 NOTE — Telephone Encounter (Signed)
Notified pt rx's ready for pick-up../lmb 

## 2014-07-27 NOTE — Telephone Encounter (Signed)
Both - Done hardcopy to [employer]

## 2014-09-16 ENCOUNTER — Ambulatory Visit
Admission: RE | Admit: 2014-09-16 | Discharge: 2014-09-16 | Disposition: A | Discharge status: Home / Self Care | Payer: BLUE CROSS/BLUE SHIELD | Source: Ambulatory Visit | Attending: Internal Medicine | Admitting: Internal Medicine

## 2014-09-16 DIAGNOSIS — E049 Nontoxic goiter, unspecified: Secondary | ICD-10-CM

## 2014-09-17 ENCOUNTER — Other Ambulatory Visit: Payer: Self-pay | Admitting: Internal Medicine

## 2014-09-17 ENCOUNTER — Encounter: Payer: Self-pay | Admitting: Internal Medicine

## 2014-09-17 DIAGNOSIS — E041 Nontoxic single thyroid nodule: Secondary | ICD-10-CM

## 2014-09-30 ENCOUNTER — Other Ambulatory Visit: Payer: Self-pay | Admitting: Internal Medicine

## 2014-09-30 DIAGNOSIS — F988 Other specified behavioral and emotional disorders with onset usually occurring in childhood and adolescence: Secondary | ICD-10-CM

## 2014-09-30 NOTE — Telephone Encounter (Signed)
Patient is requesting script for adderall

## 2014-09-30 NOTE — Telephone Encounter (Signed)
Please advise in PCP's absence, thanks! 

## 2014-10-07 ENCOUNTER — Telehealth: Payer: Self-pay | Admitting: Internal Medicine

## 2014-10-07 NOTE — Telephone Encounter (Signed)
Patient need a Prior Auth for medication Adderall.

## 2014-10-11 ENCOUNTER — Telehealth: Payer: Self-pay | Admitting: Internal Medicine

## 2014-10-11 DIAGNOSIS — F988 Other specified behavioral and emotional disorders with onset usually occurring in childhood and adolescence: Secondary | ICD-10-CM

## 2014-10-11 NOTE — Telephone Encounter (Signed)
Pa initiated

## 2014-10-11 NOTE — Telephone Encounter (Signed)
Patient is calling back, call her when her medication Adderall is ready to be picked up.

## 2014-10-11 NOTE — Telephone Encounter (Signed)
Pa initiated via covermymeds KEY: [id number] Script Adderall waiting on response

## 2014-10-12 MED ORDER — AMPHETAMINE-DEXTROAMPHETAMINE 20 MG PO TABS: 20.0000 mg | ORAL_TABLET | Freq: Every day | ORAL | Status: DC

## 2014-10-12 NOTE — Addendum Note (Signed)
Addended by: Donnita Falls on: 10/12/2014 08:58 AM   Modules accepted: Orders

## 2014-10-12 NOTE — Addendum Note (Signed)
Addended by: Corwin Levins on: 10/12/2014 12:54 PM   Modules accepted: Orders

## 2014-10-12 NOTE — Telephone Encounter (Signed)
PA has already been approved. Pt needs rf.

## 2014-10-12 NOTE — Telephone Encounter (Signed)
Done hardcopy to Dahlia  

## 2014-10-12 NOTE — Telephone Encounter (Signed)
NO PA needed. Request within qty limit

## 2014-10-20 ENCOUNTER — Other Ambulatory Visit (HOSPITAL_COMMUNITY)
Admission: RE | Admit: 2014-10-20 | Discharge: 2014-10-20 | Disposition: A | Discharge status: Home / Self Care | Payer: BLUE CROSS/BLUE SHIELD | Source: Ambulatory Visit | Attending: Interventional Radiology | Admitting: Interventional Radiology

## 2014-10-20 ENCOUNTER — Ambulatory Visit
Admission: RE | Admit: 2014-10-20 | Discharge: 2014-10-20 | Disposition: A | Discharge status: Home / Self Care | Payer: BLUE CROSS/BLUE SHIELD | Source: Ambulatory Visit | Attending: Internal Medicine | Admitting: Internal Medicine

## 2014-10-20 DIAGNOSIS — E041 Nontoxic single thyroid nodule: Secondary | ICD-10-CM

## 2014-10-22 ENCOUNTER — Encounter: Payer: Self-pay | Admitting: Internal Medicine

## 2014-11-12 ENCOUNTER — Other Ambulatory Visit: Payer: Self-pay | Admitting: Internal Medicine

## 2014-11-19 ENCOUNTER — Ambulatory Visit (INDEPENDENT_AMBULATORY_CARE_PROVIDER_SITE_OTHER): Payer: BLUE CROSS/BLUE SHIELD | Admitting: Internal Medicine

## 2014-11-19 ENCOUNTER — Other Ambulatory Visit (INDEPENDENT_AMBULATORY_CARE_PROVIDER_SITE_OTHER): Payer: BLUE CROSS/BLUE SHIELD

## 2014-11-19 ENCOUNTER — Encounter: Payer: Self-pay | Admitting: Internal Medicine

## 2014-11-19 VITALS — BP 116/76 | HR 90 | Temp 98.3°F | Ht 66.0 in | Wt 149.0 lb

## 2014-11-19 DIAGNOSIS — Z0000 Encounter for general adult medical examination without abnormal findings: Secondary | ICD-10-CM

## 2014-11-19 DIAGNOSIS — F988 Other specified behavioral and emotional disorders with onset usually occurring in childhood and adolescence: Secondary | ICD-10-CM

## 2014-11-19 DIAGNOSIS — E049 Nontoxic goiter, unspecified: Secondary | ICD-10-CM

## 2014-11-19 LAB — URINALYSIS, ROUTINE W REFLEX MICROSCOPIC
Bilirubin Urine: NEGATIVE
Hgb urine dipstick: NEGATIVE
Ketones, ur: NEGATIVE
Leukocytes, UA: NEGATIVE
Nitrite: NEGATIVE
RBC / HPF: NONE SEEN (ref 0–?)
Specific Gravity, Urine: 1.025 (ref 1.000–1.030)
Total Protein, Urine: NEGATIVE
Urine Glucose: NEGATIVE
Urobilinogen, UA: 0.2 (ref 0.0–1.0)
pH: 6 (ref 5.0–8.0)

## 2014-11-19 LAB — CBC WITH DIFFERENTIAL/PLATELET
Basophils Absolute: 0 10*3/uL (ref 0.0–0.1)
Basophils Relative: 0.4 % (ref 0.0–3.0)
Eosinophils Absolute: 0.1 10*3/uL (ref 0.0–0.7)
Eosinophils Relative: 2.1 % (ref 0.0–5.0)
HCT: 42.2 % (ref 36.0–46.0)
Hemoglobin: 14.4 g/dL (ref 12.0–15.0)
Lymphocytes Relative: 25.6 % (ref 12.0–46.0)
Lymphs Abs: 1.5 10*3/uL (ref 0.7–4.0)
MCHC: 34.3 g/dL (ref 30.0–36.0)
MCV: 96.6 fl (ref 78.0–100.0)
Monocytes Absolute: 0.5 10*3/uL (ref 0.1–1.0)
Monocytes Relative: 8.7 % (ref 3.0–12.0)
Neutro Abs: 3.7 10*3/uL (ref 1.4–7.7)
Neutrophils Relative %: 63.2 % (ref 43.0–77.0)
Platelets: 294 10*3/uL (ref 150.0–400.0)
RBC: 4.36 Mil/uL (ref 3.87–5.11)
RDW: 13.3 % (ref 11.5–15.5)
WBC: 5.9 10*3/uL (ref 4.0–10.5)

## 2014-11-19 LAB — BASIC METABOLIC PANEL
BUN: 13 mg/dL (ref 6–23)
CO2: 30 mEq/L (ref 19–32)
Calcium: 9.9 mg/dL (ref 8.4–10.5)
Chloride: 103 mEq/L (ref 96–112)
Creatinine, Ser: 0.66 mg/dL (ref 0.40–1.20)
GFR: 110.37 mL/min (ref >60.00)
Glucose, Bld: 87 mg/dL (ref 70–99)
Potassium: 4.2 mEq/L (ref 3.5–5.1)
Sodium: 141 mEq/L (ref 135–145)

## 2014-11-19 LAB — HEPATIC FUNCTION PANEL
ALT: 16 U/L (ref 0–35)
AST: 16 U/L (ref 0–37)
Albumin: 4.8 g/dL (ref 3.5–5.2)
Alkaline Phosphatase: 49 U/L (ref 39–117)
Bilirubin, Direct: 0.2 mg/dL (ref 0.0–0.3)
Total Bilirubin: 1.1 mg/dL (ref 0.2–1.2)
Total Protein: 7.7 g/dL (ref 6.0–8.3)

## 2014-11-19 LAB — LIPID PANEL
Cholesterol: 184 mg/dL (ref 0–200)
HDL: 72.8 mg/dL (ref >39.00)
LDL Cholesterol: 77 mg/dL (ref 0–99)
NonHDL: 111.18
Total CHOL/HDL Ratio: 3
Triglycerides: 169 mg/dL — ABNORMAL HIGH (ref 0.0–149.0)
VLDL: 33.8 mg/dL (ref 0.0–40.0)

## 2014-11-19 LAB — TSH: TSH: 0.66 u[IU]/mL (ref 0.35–4.50)

## 2014-11-19 MED ORDER — AMPHETAMINE-DEXTROAMPHETAMINE 20 MG PO TABS: 20.0000 mg | ORAL_TABLET | Freq: Every day | ORAL | Status: DC

## 2014-11-19 NOTE — Progress Notes (Signed)
Subjective:    Patient ID: Dana Cervantes, female    DOB: 29-Jun-1980, 34 y.o.   MRN: [id number]  HPI  Here for wellness and f/u;  Overall doing ok;  Pt denies Chest pain, worsening SOB, DOE, wheezing, orthopnea, PND, worsening LE edema, palpitations, dizziness or syncope.  Pt denies neurological change such as new headache, facial or extremity weakness.  Pt denies polydipsia, polyuria, or low sugar symptoms. Pt states overall good compliance with treatment and medications, good tolerability, and has been trying to follow appropriate diet.  Pt denies worsening depressive symptoms, suicidal ideation or panic. No fever, night sweats, wt loss, loss of appetite, or other constitutional symptoms.  Pt states good ability with ADL's, has low fall risk, home safety reviewed and adequate, no other significant changes in hearing or vision, and only occasionally active with exercise.  ADD med not working quite as well in the past month, but not enough to consider need for change.  Has been some stress in past month as well with more work responsibility.  Denies hyper or hypo thyroid symptoms such as voice, skin or hair change. Wt Readings from Last 3 Encounters:  11/19/14 149 lb (67.586 kg)  01/27/14 142 lb 12 oz (64.751 kg)  01/20/13 134 lb 2 oz (60.839 kg)   Past Medical History  Diagnosis Date  . HYPERLIPIDEMIA 06/03/2008  . ANEMIA-IRON DEFICIENCY 06/03/2008  . ANXIETY 11/09/2006  . DEPRESSION 11/09/2006  . ADD 11/09/2006  . MIGRAINE HEADACHE 11/09/2006  . ALLERGIC RHINITIS 12/08/2008  . ECZEMA 06/02/2009   Past Surgical History  Procedure Laterality Date  . S/p iud    . Tonsillectomy      reports that she has never smoked. She does not have any smokeless tobacco history on file. She reports that she does not drink alcohol or use illicit drugs. family history includes Cancer in her father; Hyperlipidemia in her father. No Known Allergies Current Outpatient Prescriptions on File Prior to Visit    Medication Sig Dispense Refill  . ALPRAZolam (XANAX) 0.5 MG tablet TAKE 1/2-1 TABLE BY MOUTH DAILY AS NEEDED FOR NERVEs 30 tablet 5  . fluticasone (FLONASE) 50 MCG/ACT nasal spray Place 2 sprays into both nostrils daily. 48 g 3  . triamcinolone cream (KENALOG) 0.1 % Apply 1 application topically 2 (two) times daily. 30 g 1   No current facility-administered medications on file prior to visit.   Review of Systems Constitutional: Negative for increased diaphoresis, other activity, appetite or siginficant weight change other than noted HENT: Negative for worsening hearing loss, ear pain, facial swelling, mouth sores and neck stiffness.   Eyes: Negative for other worsening pain, redness or visual disturbance.  Respiratory: Negative for shortness of breath and wheezing  Cardiovascular: Negative for chest pain and palpitations.  Gastrointestinal: Negative for diarrhea, blood in stool, abdominal distention or other pain Genitourinary: Negative for hematuria, flank pain or change in urine volume.  Musculoskeletal: Negative for myalgias or other joint complaints.  Skin: Negative for color change and wound or drainage.  Neurological: Negative for syncope and numbness. other than noted Hematological: Negative for adenopathy. or other swelling Psychiatric/Behavioral: Negative for hallucinations, SI, self-injury, decreased concentration or other worsening agitation.      Objective:   Physical Exam BP 116/76 mmHg  Pulse 90  Temp(Src) 98.3 F (36.8 C) (Oral)  Ht 5\' 6"  (1.676 m)  Wt 149 lb (67.586 kg)  BMI 24.06 kg/m2  SpO2 97%  LMP 11/14/2014 VS noted,  Constitutional: Pt is oriented  to person, place, and time. Appears well-developed and well-nourished, in no significant distress Head: Normocephalic and atraumatic.  Right Ear: External ear normal.  Left Ear: External ear normal.  Nose: Nose normal.  Mouth/Throat: Oropharynx is clear and moist.  Eyes: Conjunctivae and EOM are normal. Pupils  are equal, round, and reactive to light.  Neck: Normal range of motion. Neck supple. No JVD present. No tracheal deviation present or significant neck LA or mass Cardiovascular: Normal rate, regular rhythm, normal heart sounds and intact distal pulses.   Pulmonary/Chest: Effort normal and breath sounds without rales or wheezing  Abdominal: Soft. Bowel sounds are normal. NT. No HSM  Musculoskeletal: Normal range of motion. Exhibits no edema.  Lymphadenopathy:  Has no cervical adenopathy.  Neurological: Pt is alert and oriented to person, place, and time. Pt has normal reflexes. No cranial nerve deficit. Motor grossly intact Skin: Skin is warm and dry. No rash noted.  Psychiatric:  Has normal mood and affect. Behavior is normal.      Assessment & Plan:

## 2014-11-19 NOTE — Assessment & Plan Note (Signed)
C/w mutlinod goiter, no further eval or tx needed at this time, d/w pt

## 2014-11-19 NOTE — Patient Instructions (Addendum)
You had the flu shot today  Please continue all other medications as before, and refills have been done if requested - the adderall  Please have the pharmacy call with any other refills you may need.  Please continue your efforts at being more active, low cholesterol diet, and weight control.  You are otherwise up to date with prevention measures today.  Please keep your appointments with your specialists as you may have planned  Please go to the LAB in the Basement (turn left off the elevator) for the tests to be done today  You will be contacted by phone if any changes need to be made immediately.  Otherwise, you will receive a letter about your results with an explanation, but please check with MyChart first.  Please remember to sign up for MyChart if you have not done so, as this will be important to you in the future with finding out test results, communicating by private email, and scheduling acute appointments online when needed.  Please return in 1 year for your yearly visit, or sooner if needed, with Lab testing done 3-5 days before  

## 2014-11-19 NOTE — Progress Notes (Signed)
Pre visit review using our clinic review tool, if applicable. No additional management support is needed unless otherwise documented below in the visit note. 

## 2014-11-19 NOTE — Assessment & Plan Note (Signed)
stable overall by history and exam, recent data reviewed with pt, and pt to continue medical treatment as before,  to f/u any worsening symptoms or concerns Lab Results  Component Value Date   WBC 7.3 01/27/2014   HGB 14.5 01/27/2014   HCT 43.0 01/27/2014   PLT 238.0 01/27/2014   GLUCOSE 94 01/27/2014   CHOL 158 01/27/2014   TRIG 112.0 01/27/2014   HDL 53.30 01/27/2014   LDLCALC 82 01/27/2014   ALT 16 01/27/2014   AST 14 01/27/2014   NA 135 01/27/2014   K 4.2 01/27/2014   CL 102 01/27/2014   CREATININE 1.4* 01/27/2014   BUN 15 01/27/2014   CO2 28 01/27/2014   TSH 0.52 01/27/2014

## 2014-11-19 NOTE — Assessment & Plan Note (Signed)

## 2015-01-20 ENCOUNTER — Telehealth: Payer: Self-pay | Admitting: *Deleted

## 2015-01-20 NOTE — Telephone Encounter (Signed)
Receive call pt requesting refills on her alprazolam../LMB

## 2015-01-21 MED ORDER — ALPRAZOLAM 0.5 MG PO TABS: ORAL_TABLET | ORAL | Status: DC

## 2015-01-21 NOTE — Telephone Encounter (Signed)
pls advise on msg below.../lmb 

## 2015-01-21 NOTE — Telephone Encounter (Signed)
Done hardcopy to Dahlia  

## 2015-01-21 NOTE — Telephone Encounter (Signed)
Notified pt rx faxed to [facility]../lmb  

## 2015-01-28 ENCOUNTER — Other Ambulatory Visit: Payer: Self-pay | Admitting: Internal Medicine

## 2015-04-19 ENCOUNTER — Telehealth: Payer: Self-pay | Admitting: *Deleted

## 2015-04-19 DIAGNOSIS — F988 Other specified behavioral and emotional disorders with onset usually occurring in childhood and adolescence: Secondary | ICD-10-CM

## 2015-04-19 NOTE — Telephone Encounter (Signed)
Receive call pt is wanting refills on her Adderral.scripts for 3 month./lmb

## 2015-04-20 MED ORDER — AMPHETAMINE-DEXTROAMPHETAMINE 20 MG PO TABS: 20.0000 mg | ORAL_TABLET | Freq: Every day | ORAL | Status: DC

## 2015-04-20 NOTE — Telephone Encounter (Signed)
pls advise on msg below.../lmb 

## 2015-04-20 NOTE — Telephone Encounter (Signed)
Notified pt rx's ready fro pick-up...[name]

## 2015-04-20 NOTE — Telephone Encounter (Signed)
Ok , 3 rx - .Done hardcopy to [employer]

## 2015-05-12 ENCOUNTER — Other Ambulatory Visit (INDEPENDENT_AMBULATORY_CARE_PROVIDER_SITE_OTHER): Payer: BLUE CROSS/BLUE SHIELD

## 2015-05-12 ENCOUNTER — Encounter: Payer: Self-pay | Admitting: Internal Medicine

## 2015-05-12 ENCOUNTER — Other Ambulatory Visit: Payer: BLUE CROSS/BLUE SHIELD

## 2015-05-12 ENCOUNTER — Other Ambulatory Visit: Payer: Self-pay | Admitting: Internal Medicine

## 2015-05-12 ENCOUNTER — Ambulatory Visit (INDEPENDENT_AMBULATORY_CARE_PROVIDER_SITE_OTHER): Payer: BLUE CROSS/BLUE SHIELD | Admitting: Internal Medicine

## 2015-05-12 VITALS — BP 126/82 | HR 89 | Temp 98.6°F | Resp 20 | Wt 151.0 lb

## 2015-05-12 DIAGNOSIS — F329 Major depressive disorder, single episode, unspecified: Secondary | ICD-10-CM | POA: Diagnosis not present

## 2015-05-12 DIAGNOSIS — F32A Depression, unspecified: Secondary | ICD-10-CM

## 2015-05-12 DIAGNOSIS — F411 Generalized anxiety disorder: Secondary | ICD-10-CM

## 2015-05-12 DIAGNOSIS — R300 Dysuria: Secondary | ICD-10-CM | POA: Insufficient documentation

## 2015-05-12 LAB — URINALYSIS, ROUTINE W REFLEX MICROSCOPIC
Bilirubin Urine: NEGATIVE
Hgb urine dipstick: NEGATIVE
Ketones, ur: 40 — AB
Leukocytes, UA: NEGATIVE
Nitrite: NEGATIVE
RBC / HPF: NONE SEEN (ref 0–?)
Specific Gravity, Urine: 1.025 (ref 1.000–1.030)
Total Protein, Urine: NEGATIVE
Urine Glucose: NEGATIVE
Urobilinogen, UA: 0.2 (ref 0.0–1.0)
WBC, UA: NONE SEEN (ref 0–?)
pH: 5.5 (ref 5.0–8.0)

## 2015-05-12 LAB — POCT URINALYSIS DIPSTICK
Bilirubin, UA: NEGATIVE
Blood, UA: NEGATIVE
Glucose, UA: NEGATIVE
Ketones, UA: NEGATIVE
Leukocytes, UA: NEGATIVE
Nitrite, UA: NEGATIVE
Protein, UA: NEGATIVE
Spec Grav, UA: 1.025
Urobilinogen, UA: NEGATIVE
pH, UA: 6

## 2015-05-12 MED ORDER — CLONAZEPAM 0.5 MG PO TABS: 0.5000 mg | ORAL_TABLET | Freq: Two times a day (BID) | ORAL | Status: DC | PRN

## 2015-05-12 MED ORDER — ESCITALOPRAM OXALATE 10 MG PO TABS: 10.0000 mg | ORAL_TABLET | Freq: Every day | ORAL | Status: DC

## 2015-05-12 NOTE — Progress Notes (Signed)
Pre visit review using our clinic review tool, if applicable. No additional management support is needed unless otherwise documented below in the visit note. 

## 2015-05-12 NOTE — Assessment & Plan Note (Signed)
stable overall by history and exam, recent data reviewed with pt, and pt to continue medical treatment as before,  to f/u any worsening symptoms or concerns Lab Results  Component Value Date   WBC 5.9 11/19/2014   HGB 14.4 11/19/2014   HCT 42.2 11/19/2014   PLT 294.0 11/19/2014   GLUCOSE 87 11/19/2014   CHOL 184 11/19/2014   TRIG 169.0* 11/19/2014   HDL 72.80 11/19/2014   LDLCALC 77 11/19/2014   ALT 16 11/19/2014   AST 16 11/19/2014   NA 141 11/19/2014   K 4.2 11/19/2014   CL 103 11/19/2014   CREATININE 0.66 11/19/2014   BUN 13 11/19/2014   CO2 30 11/19/2014   TSH 0.66 11/19/2014

## 2015-05-12 NOTE — Assessment & Plan Note (Signed)
Etiology unclear, may be due to incresaed fluid intake, Udip neg, wil check urine cx as well, and hold on antibx for now as exam currently not c/w UTI

## 2015-05-12 NOTE — Assessment & Plan Note (Signed)
Ok for change xanax to klonopin longer acting, low dose, and lexapro 10 qd,  to f/u any worsening symptoms or concerns, declines referral for counseling or psychiatry

## 2015-05-12 NOTE — Patient Instructions (Signed)
Please take all new medication as prescribed - the lexapro at 10 mg per day, as well as the klonopin as needed  Please continue all other medications as before, and refills have been done if requested.  Please have the pharmacy call with any other refills you may need.  Please keep your appointments with your specialists as you may have planned  Please go to the LAB in the Basement (turn left off the elevator) for the tests to be done today - just the urine testing today  You will be contacted by phone if any changes need to be made immediately.  Otherwise, you will receive a letter about your results with an explanation, but please check with MyChart first.  Please return in 6 months, or sooner if needed

## 2015-05-12 NOTE — Progress Notes (Signed)
Subjective:    Patient ID: Dana Cervantes, female    DOB: 1980-06-16, 35 y.o.   MRN: [id number]  HPI  Here to f/u with c/o mild dysuria and frequency though she admittedtly drinks plenty of fluids every day, just seems different with increased freq but small volumes.. Denies urinary symptoms such as worsening urgency, flank pain, hematuria or n/v, fever, chills. Denies worsening reflux, abd pain, dysphagia, n/v, bowel change or blood. Pt denies chest pain, increased sob or doe, wheezing, orthopnea, PND, increased LE swelling, palpitations, dizziness or syncope.  Also with increased stress recnetly - Adopted Son with new Type I DM at 12yo, very stressful, has tried zoloft in the past she recalls and maybe 2 others , but not tried lexapro. Denies worsening depressive symptoms, suicidal ideation, or panic.  Feeling overwhelmed,. Denies SI or HI. No ETOH or other illicit substance use.  Xanax once daily not working as only lasts a few hours Past Medical History  Diagnosis Date  . HYPERLIPIDEMIA 06/03/2008  . ANEMIA-IRON DEFICIENCY 06/03/2008  . ANXIETY 11/09/2006  . DEPRESSION 11/09/2006  . ADD 11/09/2006  . MIGRAINE HEADACHE 11/09/2006  . ALLERGIC RHINITIS 12/08/2008  . ECZEMA 06/02/2009   Past Surgical History  Procedure Laterality Date  . S/p iud    . Tonsillectomy      reports that she has never smoked. She does not have any smokeless tobacco history on file. She reports that she does not drink alcohol or use illicit drugs. family history includes Cancer in her father; Hyperlipidemia in her father. No Known Allergies Current Outpatient Prescriptions on File Prior to Visit  Medication Sig Dispense Refill  . amphetamine-dextroamphetamine (ADDERALL) 20 MG tablet Take 1 tablet (20 mg total) by mouth daily. 30 tablet 0  . fluticasone (FLONASE) 50 MCG/ACT nasal spray PLACE 2 SPRAYS INTO BOTH NOSTRILS DAILY. 48 g 3  . triamcinolone cream (KENALOG) 0.1 % Apply 1 application topically 2 (two) times  daily. 30 g 1   No current facility-administered medications on file prior to visit.     Review of Systems  Constitutional: Negative for unusual diaphoresis or night sweats HENT: Negative for ringing in ear or discharge Eyes: Negative for double vision or worsening visual disturbance.  Respiratory: Negative for choking and stridor.   Gastrointestinal: Negative for vomiting or other signifcant bowel change Genitourinary: Negative for hematuria or change in urine volume.  Musculoskeletal: Negative for other MSK pain or swelling Skin: Negative for color change and worsening wound.  Neurological: Negative for tremors and numbness other than noted  Psychiatric/Behavioral: Negative for decreased concentration or agitation other than above       Objective:   Physical Exam BP 126/82 mmHg  Pulse 89  Temp(Src) 98.6 F (37 C) (Oral)  Resp 20  Wt 151 lb (68.493 kg)  SpO2 98% VS noted, not ill appearing Constitutional: Pt appears in no significant distress HENT: Head: NCAT.  Right Ear: External ear normal.  Left Ear: External ear normal.  Eyes: . Pupils are equal, round, and reactive to light. Conjunctivae and EOM are normal Neck: Normal range of motion. Neck supple.  Cardiovascular: Normal rate and regular rhythm.   Pulmonary/Chest: Effort normal and breath sounds without rales or wheezing.  Abd:  Soft, NT, ND, + BS, no flank tender, benign exam Neurological: Pt is alert. Not confused , motor grossly intact Skin: Skin is warm. No rash, no LE edema Psychiatric: Pt behavior is normal. No agitation. 1-2+ nervous  POCT Urinalysis Dipstick  Status:  Finalresult Visible to patient:  Not Released Dx:  Dysuria   Normal           Ref Range 10:55 AM  33mo ago  62yr ago  14yr ago     Color, UA  yellow       Clarity, UA  cloudy       Glucose, UA  negative       Bilirubin, UA  negative       Ketones, UA  negative       Spec Grav, UA  1.025       Blood, UA  negative        pH, UA  6.0       Protein, UA  negative       Urobilinogen, UA  negative 0.2 0.2 0.2    Nitrite, UA  negative       Leukocytes, UA Negative  Negative                Assessment & Plan:

## 2015-05-14 LAB — URINE CULTURE
Colony Count: NO GROWTH
Organism ID, Bacteria: NO GROWTH

## 2015-07-07 DIAGNOSIS — H1013 Acute atopic conjunctivitis, bilateral: Secondary | ICD-10-CM | POA: Diagnosis not present

## 2015-07-07 DIAGNOSIS — H66001 Acute suppurative otitis media without spontaneous rupture of ear drum, right ear: Secondary | ICD-10-CM | POA: Diagnosis not present

## 2015-07-07 DIAGNOSIS — J309 Allergic rhinitis, unspecified: Secondary | ICD-10-CM | POA: Diagnosis not present

## 2015-07-12 ENCOUNTER — Other Ambulatory Visit: Payer: Self-pay | Admitting: Internal Medicine

## 2015-07-12 NOTE — Telephone Encounter (Signed)
Please advise 

## 2015-07-12 NOTE — Telephone Encounter (Signed)
Done hardcopy to Corinne  

## 2015-07-13 NOTE — Telephone Encounter (Signed)
Medication sent to pharmacy  

## 2015-07-13 NOTE — Telephone Encounter (Signed)
Medication was sent to pharmacy.

## 2015-08-09 ENCOUNTER — Telehealth: Payer: Self-pay | Admitting: *Deleted

## 2015-08-09 DIAGNOSIS — F988 Other specified behavioral and emotional disorders with onset usually occurring in childhood and adolescence: Secondary | ICD-10-CM

## 2015-08-09 MED ORDER — AMPHETAMINE-DEXTROAMPHETAMINE 20 MG PO TABS: 20.0000 mg | ORAL_TABLET | Freq: Every day | ORAL | Status: DC

## 2015-08-09 NOTE — Telephone Encounter (Signed)
Ok for adderall, but too soon for any benzodiazepine as has rx on apr 25 for klonopin bid for 3 mo total tx

## 2015-08-09 NOTE — Telephone Encounter (Signed)
Left msg on triage requesting refills on Adderral & Xanax.Dana Cervantes.[name]/lmb

## 2015-08-09 NOTE — Telephone Encounter (Signed)
Notified pt rx ready for pick-up.../lmb 

## 2015-08-16 ENCOUNTER — Other Ambulatory Visit: Payer: Self-pay | Admitting: Internal Medicine

## 2015-08-16 NOTE — Telephone Encounter (Signed)
Please advise 

## 2015-08-16 NOTE — Telephone Encounter (Signed)
Xanax refill not appropriate as the klonopin rx for April 25 has 2 refills

## 2015-09-05 ENCOUNTER — Other Ambulatory Visit: Payer: Self-pay | Admitting: Internal Medicine

## 2015-09-06 NOTE — Telephone Encounter (Signed)
Please advise, thanks.

## 2015-09-07 NOTE — Telephone Encounter (Signed)
Done hardcopy to Corinne  

## 2015-09-08 NOTE — Telephone Encounter (Signed)
Medication faxed to pharmacy 

## 2015-10-05 DIAGNOSIS — H5212 Myopia, left eye: Secondary | ICD-10-CM | POA: Diagnosis not present

## 2015-10-05 DIAGNOSIS — H52223 Regular astigmatism, bilateral: Secondary | ICD-10-CM | POA: Diagnosis not present

## 2015-10-16 ENCOUNTER — Other Ambulatory Visit: Payer: Self-pay | Admitting: Internal Medicine

## 2015-10-17 ENCOUNTER — Telehealth: Payer: Self-pay | Admitting: Emergency Medicine

## 2015-10-17 DIAGNOSIS — F988 Other specified behavioral and emotional disorders with onset usually occurring in childhood and adolescence: Secondary | ICD-10-CM

## 2015-10-17 MED ORDER — AMPHETAMINE-DEXTROAMPHETAMINE 20 MG PO TABS: 20.0000 mg | ORAL_TABLET | Freq: Every day | ORAL | 0 refills | Status: DC

## 2015-10-17 NOTE — Telephone Encounter (Signed)
Done hardcopy to Corinne  

## 2015-10-17 NOTE — Telephone Encounter (Signed)
Pt called and needs a prescription refill on amphetamine-dextroamphetamine (ADDERALL) 20 MG tablet. Pharmacy is [id number] - [place], [place] - [address] Please follow up thanks.

## 2015-10-18 NOTE — Telephone Encounter (Signed)
Patient aware placed up front for pick up 

## 2015-11-22 ENCOUNTER — Telehealth: Payer: Self-pay | Admitting: Emergency Medicine

## 2015-11-22 DIAGNOSIS — F988 Other specified behavioral and emotional disorders with onset usually occurring in childhood and adolescence: Secondary | ICD-10-CM

## 2015-11-22 MED ORDER — AMPHETAMINE-DEXTROAMPHETAMINE 20 MG PO TABS: 20.0000 mg | ORAL_TABLET | Freq: Every day | ORAL | 0 refills | Status: DC

## 2015-11-22 NOTE — Telephone Encounter (Signed)
Patient aware.

## 2015-11-22 NOTE — Telephone Encounter (Signed)
Done hardcopy to Corinne  Please ask pt to make yearly office visit

## 2015-11-22 NOTE — Telephone Encounter (Signed)
Pt called and needs a prescription refill on amphetamine-dextroamphetamine (ADDERALL) 20 MG tablet. Please follow up thanks.

## 2015-12-21 ENCOUNTER — Ambulatory Visit (INDEPENDENT_AMBULATORY_CARE_PROVIDER_SITE_OTHER): Payer: BLUE CROSS/BLUE SHIELD | Admitting: Internal Medicine

## 2015-12-21 ENCOUNTER — Other Ambulatory Visit (INDEPENDENT_AMBULATORY_CARE_PROVIDER_SITE_OTHER): Payer: BLUE CROSS/BLUE SHIELD

## 2015-12-21 VITALS — BP 128/80 | HR 98 | Temp 98.6°F | Resp 20 | Wt 137.0 lb

## 2015-12-21 DIAGNOSIS — Z0001 Encounter for general adult medical examination with abnormal findings: Secondary | ICD-10-CM | POA: Diagnosis not present

## 2015-12-21 DIAGNOSIS — E049 Nontoxic goiter, unspecified: Secondary | ICD-10-CM | POA: Diagnosis not present

## 2015-12-21 DIAGNOSIS — F988 Other specified behavioral and emotional disorders with onset usually occurring in childhood and adolescence: Secondary | ICD-10-CM | POA: Diagnosis not present

## 2015-12-21 DIAGNOSIS — Z23 Encounter for immunization: Secondary | ICD-10-CM | POA: Diagnosis not present

## 2015-12-21 DIAGNOSIS — F411 Generalized anxiety disorder: Secondary | ICD-10-CM | POA: Diagnosis not present

## 2015-12-21 DIAGNOSIS — E785 Hyperlipidemia, unspecified: Secondary | ICD-10-CM

## 2015-12-21 DIAGNOSIS — R238 Other skin changes: Secondary | ICD-10-CM | POA: Insufficient documentation

## 2015-12-21 DIAGNOSIS — R233 Spontaneous ecchymoses: Secondary | ICD-10-CM

## 2015-12-21 DIAGNOSIS — Z0000 Encounter for general adult medical examination without abnormal findings: Secondary | ICD-10-CM | POA: Diagnosis not present

## 2015-12-21 DIAGNOSIS — L659 Nonscarring hair loss, unspecified: Secondary | ICD-10-CM | POA: Insufficient documentation

## 2015-12-21 LAB — HEPATIC FUNCTION PANEL
ALT: 20 U/L (ref 0–35)
AST: 18 U/L (ref 0–37)
Albumin: 4.5 g/dL (ref 3.5–5.2)
Alkaline Phosphatase: 27 U/L — ABNORMAL LOW (ref 39–117)
Bilirubin, Direct: 0.2 mg/dL (ref 0.0–0.3)
Total Bilirubin: 1 mg/dL (ref 0.2–1.2)
Total Protein: 7.1 g/dL (ref 6.0–8.3)

## 2015-12-21 LAB — BASIC METABOLIC PANEL
BUN: 11 mg/dL (ref 6–23)
CO2: 29 mEq/L (ref 19–32)
Calcium: 9 mg/dL (ref 8.4–10.5)
Chloride: 99 mEq/L (ref 96–112)
Creatinine, Ser: 0.52 mg/dL (ref 0.40–1.20)
GFR: 144.34 mL/min (ref >60.00)
Glucose, Bld: 77 mg/dL (ref 70–99)
Potassium: 3.9 mEq/L (ref 3.5–5.1)
Sodium: 135 mEq/L (ref 135–145)

## 2015-12-21 LAB — URINALYSIS, ROUTINE W REFLEX MICROSCOPIC
Bilirubin Urine: NEGATIVE
Hgb urine dipstick: NEGATIVE
Ketones, ur: NEGATIVE
Leukocytes, UA: NEGATIVE
Nitrite: NEGATIVE
RBC / HPF: NONE SEEN (ref 0–?)
Specific Gravity, Urine: 1.01 (ref 1.000–1.030)
Total Protein, Urine: NEGATIVE
Urine Glucose: NEGATIVE
Urobilinogen, UA: 0.2 (ref 0.0–1.0)
WBC, UA: NONE SEEN (ref 0–?)
pH: 6.5 (ref 5.0–8.0)

## 2015-12-21 LAB — LIPID PANEL
Cholesterol: 156 mg/dL (ref 0–200)
HDL: 82.4 mg/dL (ref >39.00)
LDL Cholesterol: 52 mg/dL (ref 0–99)
NonHDL: 73.39
Total CHOL/HDL Ratio: 2
Triglycerides: 109 mg/dL (ref 0.0–149.0)
VLDL: 21.8 mg/dL (ref 0.0–40.0)

## 2015-12-21 LAB — CBC WITH DIFFERENTIAL/PLATELET
Basophils Absolute: 0 10*3/uL (ref 0.0–0.1)
Basophils Relative: 0.3 % (ref 0.0–3.0)
Eosinophils Absolute: 0.1 10*3/uL (ref 0.0–0.7)
Eosinophils Relative: 2 % (ref 0.0–5.0)
HCT: 37.7 % (ref 36.0–46.0)
Hemoglobin: 13.2 g/dL (ref 12.0–15.0)
Lymphocytes Relative: 28 % (ref 12.0–46.0)
Lymphs Abs: 1.9 10*3/uL (ref 0.7–4.0)
MCHC: 35.1 g/dL (ref 30.0–36.0)
MCV: 95.5 fl (ref 78.0–100.0)
Monocytes Absolute: 0.7 10*3/uL (ref 0.1–1.0)
Monocytes Relative: 10.8 % (ref 3.0–12.0)
Neutro Abs: 3.9 10*3/uL (ref 1.4–7.7)
Neutrophils Relative %: 58.9 % (ref 43.0–77.0)
Platelets: 247 10*3/uL (ref 150.0–400.0)
RBC: 3.95 Mil/uL (ref 3.87–5.11)
RDW: 13.5 % (ref 11.5–15.5)
WBC: 6.7 10*3/uL (ref 4.0–10.5)

## 2015-12-21 LAB — TSH: TSH: 1.02 u[IU]/mL (ref 0.35–4.50)

## 2015-12-21 LAB — T4, FREE: Free T4: 0.77 ng/dL (ref 0.60–1.60)

## 2015-12-21 MED ORDER — AMPHETAMINE-DEXTROAMPHETAMINE 20 MG PO TABS
20.0000 mg | ORAL_TABLET | Freq: Every day | ORAL | 0 refills | Status: DC
Start: 2015-12-21 — End: 2015-12-21

## 2015-12-21 MED ORDER — AMPHETAMINE-DEXTROAMPHETAMINE 20 MG PO TABS: 20.0000 mg | ORAL_TABLET | Freq: Every day | ORAL | 0 refills | Status: DC

## 2015-12-21 MED ORDER — CLONAZEPAM 0.5 MG PO TABS: ORAL_TABLET | ORAL | 5 refills | Status: DC

## 2015-12-21 MED ORDER — FLUTICASONE PROPIONATE 50 MCG/ACT NA SUSP: NASAL | 3 refills | Status: DC

## 2015-12-21 NOTE — Progress Notes (Signed)
Subjective:    Patient ID: Dana Cervantes, female    DOB: 03/23/1980, 35 y.o.   MRN: [id number]  HPI  Here for wellness and f/u;  Overall doing ok;  Pt denies Chest pain, worsening SOB, DOE, wheezing, orthopnea, PND, worsening LE edema, palpitations, dizziness or syncope.  Pt denies neurological change such as new headache, facial or extremity weakness.  Pt denies polydipsia, polyuria, or low sugar symptoms. Pt states overall good compliance with treatment and medications, good tolerability, and has been trying to follow appropriate die No fever, night sweats, wt loss, loss of appetite, or other constitutional symptoms.  Pt states good ability with ADL's, has low fall risk, home safety reviewed and adequate, no other significant changes in hearing or vision, and only occasionally active with exercise -  Lost some wt with better diet, but also hair falling out. Wt Readings from Last 3 Encounters:  12/21/15 137 lb (62.1 kg)  05/12/15 151 lb (68.5 kg)  11/19/14 149 lb (67.6 kg)  Does have several wks ongoing nasal allergy symptoms with clearish congestion, itch and sneezing, without fever, pain, ST, cough, swelling or wheezing.  ADD meds working out well, successful social and work.  Does also c/o several bruises to arms but no other bruising or hematomas. Past Medical History:  Diagnosis Date  . ADD 11/09/2006  . ALLERGIC RHINITIS 12/08/2008  . ANEMIA-IRON DEFICIENCY 06/03/2008  . ANXIETY 11/09/2006  . DEPRESSION 11/09/2006  . ECZEMA 06/02/2009  . HYPERLIPIDEMIA 06/03/2008  . MIGRAINE HEADACHE 11/09/2006   Past Surgical History:  Procedure Laterality Date  . s/p IUD    . TONSILLECTOMY      reports that she has never smoked. She does not have any smokeless tobacco history on file. She reports that she does not drink alcohol or use drugs. family history includes Cancer in her father; Hyperlipidemia in her father. No Known Allergies Current Outpatient Prescriptions on File Prior to Visit    Medication Sig Dispense Refill  . triamcinolone cream (KENALOG) 0.1 % Apply 1 application topically 2 (two) times daily. 30 g 1  . escitalopram (LEXAPRO) 10 MG tablet Take 1 tablet (10 mg total) by mouth daily. 90 tablet 3   No current facility-administered medications on file prior to visit.    Review of Systems Constitutional: Negative for increased diaphoresis, or other activity, appetite or siginficant weight change other than noted HENT: Negative for worsening hearing loss, ear pain, facial swelling, mouth sores and neck stiffness.   Eyes: Negative for other worsening pain, redness or visual disturbance.  Respiratory: Negative for choking or stridor Cardiovascular: Negative for other chest pain and palpitations.  Gastrointestinal: Negative for worsening diarrhea, blood in stool, or abdominal distention Genitourinary: Negative for hematuria, flank pain or change in urine volume.  Musculoskeletal: Negative for myalgias or other joint complaints.  Skin: Negative for other color change and wound or drainage.  Neurological: Negative for syncope and numbness. other than noted Hematological: Negative for adenopathy. or other swelling Psychiatric/Behavioral: Negative for hallucinations, SI, self-injury, decreased concentration or other worsening agitation.      Objective:   Physical Exam BP 128/80   Pulse 98   Temp 98.6 F (37 C) (Oral)   Resp 20   Wt 137 lb (62.1 kg)   SpO2 93%   BMI 22.11 kg/m  VS noted,  Constitutional: Pt is oriented to person, place, and time. Appears well-developed and well-nourished, in no significant distress Head: Normocephalic and atraumatic  Eyes: Conjunctivae and EOM are normal.  Pupils are equal, round, and reactive to light Bilat tm's with mild erythema.  Max sinus areas non tender.  Pharynx with mild erythema, no exudate Right Ear: External ear normal.  Left Ear: External ear normal Nose: Nose normal.  Mouth/Throat: Oropharynx is clear and moist   Neck: Normal range of motion. Neck supple. No JVD present. No tracheal deviation present or significant neck LA or mass except for diffuse mild thyroid enlargement without tender Cardiovascular: Normal rate, regular rhythm, normal heart sounds and intact distal pulses.   Pulmonary/Chest: Effort normal and breath sounds without rales or wheezing  Abdominal: Soft. Bowel sounds are normal. NT. No HSM  Musculoskeletal: Normal range of motion. Exhibits no edema Lymphadenopathy: Has no cervical adenopathy.  Neurological: Pt is alert and oriented to person, place, and time. Pt has normal reflexes. No cranial nerve deficit. Motor grossly intact Skin: Skin is warm and dry. No rash noted or new ulcers and several bruises to the arms only Psychiatric:  Has normal mood and affect. Behavior is normal.     Assessment & Plan:

## 2015-12-21 NOTE — Patient Instructions (Addendum)

## 2015-12-21 NOTE — Progress Notes (Signed)
Pre visit review using our clinic review tool, if applicable. No additional management support is needed unless otherwise documented below in the visit note. 

## 2015-12-24 NOTE — Assessment & Plan Note (Signed)
Suspect most likely related to minor trauma, for labs as ordered, cont to follow

## 2015-12-24 NOTE — Assessment & Plan Note (Signed)

## 2015-12-24 NOTE — Assessment & Plan Note (Addendum)
stable overall by history and exam, and pt to continue medical treatment as before,  to f/u any worsening symptoms or concerns  In addition to the time spent performing CPE, I spent an additional 15 minutes face to face,in which greater than 50% of this time was spent in counseling and coordination of care for patient's illness as documented.   

## 2015-12-24 NOTE — Assessment & Plan Note (Signed)
stable overall by history and exam, recent data reviewed with pt, and pt to continue medical treatment as before,  to f/u any worsening symptoms or concerns Lab Results  Component Value Date   WBC 6.7 12/21/2015   HGB 13.2 12/21/2015   HCT 37.7 12/21/2015   PLT 247.0 12/21/2015   GLUCOSE 77 12/21/2015   CHOL 156 12/21/2015   TRIG 109.0 12/21/2015   HDL 82.40 12/21/2015   LDLCALC 52 12/21/2015   ALT 20 12/21/2015   AST 18 12/21/2015   NA 135 12/21/2015   K 3.9 12/21/2015   CL 99 12/21/2015   CREATININE 0.52 12/21/2015   BUN 11 12/21/2015   CO2 29 12/21/2015   TSH 1.02 12/21/2015

## 2015-12-24 NOTE — Assessment & Plan Note (Signed)
No overt patches, for TSH, consider dermatology referral

## 2015-12-24 NOTE — Assessment & Plan Note (Signed)
stable overall by history and exam, recent data reviewed with pt, and pt to continue medical treatment as before,  to f/u any worsening symptoms or concerns Lab Results  Component Value Date   LDLCALC 52 12/21/2015

## 2016-02-21 DIAGNOSIS — Z01419 Encounter for gynecological examination (general) (routine) without abnormal findings: Secondary | ICD-10-CM | POA: Diagnosis not present

## 2016-02-21 DIAGNOSIS — Z1389 Encounter for screening for other disorder: Secondary | ICD-10-CM | POA: Diagnosis not present

## 2016-02-21 DIAGNOSIS — Z6823 Body mass index (BMI) 23.0-23.9, adult: Secondary | ICD-10-CM | POA: Diagnosis not present

## 2016-02-21 DIAGNOSIS — Z130 Encounter for screening for diseases of the blood and blood-forming organs and certain disorders involving the immune mechanism: Secondary | ICD-10-CM | POA: Diagnosis not present

## 2016-03-15 ENCOUNTER — Telehealth: Payer: Self-pay | Admitting: *Deleted

## 2016-03-15 MED ORDER — AMPHETAMINE-DEXTROAMPHETAMINE 20 MG PO TABS: 20.0000 mg | ORAL_TABLET | Freq: Every day | ORAL | 0 refills | Status: DC

## 2016-03-15 NOTE — Telephone Encounter (Signed)
Rec'd call pt requesting 3 months scripts on her Adderrall...Dana Cervantes/lmb

## 2016-03-15 NOTE — Telephone Encounter (Signed)
Done hardcopy to Corinne  

## 2016-03-16 NOTE — Telephone Encounter (Signed)
Left message for patient advising ready for pick up

## 2016-03-24 ENCOUNTER — Other Ambulatory Visit: Payer: Self-pay | Admitting: Internal Medicine

## 2016-03-29 ENCOUNTER — Encounter: Payer: Self-pay | Admitting: Internal Medicine

## 2016-03-29 ENCOUNTER — Telehealth: Payer: Self-pay | Admitting: Internal Medicine

## 2016-03-29 MED ORDER — CLONAZEPAM 0.5 MG PO TABS: ORAL_TABLET | ORAL | 2 refills | Status: DC

## 2016-03-29 NOTE — Telephone Encounter (Signed)
Pt request refill clonazePAM (KLONOPIN) 0.5 MG tablet for next month, she was wondering if she can get written for 3 mnths, she [place] of town and it is hard for her to come up here. Please advise.

## 2016-03-29 NOTE — Telephone Encounter (Signed)
Done hardcopy to Corinne  

## 2016-03-30 NOTE — Telephone Encounter (Signed)
faxed

## 2016-06-08 ENCOUNTER — Telehealth: Payer: Self-pay | Admitting: *Deleted

## 2016-06-08 MED ORDER — AMPHETAMINE-DEXTROAMPHETAMINE 20 MG PO TABS: 20.0000 mg | ORAL_TABLET | Freq: Every day | ORAL | 0 refills | Status: DC

## 2016-06-08 NOTE — Telephone Encounter (Signed)
Rec'd call pt requesting 3 months scripts on her adderrall...Dana Cervantes/lmb

## 2016-06-08 NOTE — Telephone Encounter (Signed)
Done hardcopy to [place]  

## 2016-06-11 NOTE — Telephone Encounter (Signed)
Pt was notified r

## 2016-06-11 NOTE — Telephone Encounter (Signed)
Pt has been notified rx's ready for [pickup...[name]/lmb

## 2016-06-23 ENCOUNTER — Other Ambulatory Visit: Payer: Self-pay | Admitting: Internal Medicine

## 2016-06-26 NOTE — Telephone Encounter (Signed)
Done hardcopy to [place]  

## 2016-06-26 NOTE — Telephone Encounter (Signed)
faxed

## 2016-09-18 ENCOUNTER — Telehealth: Payer: Self-pay | Admitting: *Deleted

## 2016-09-18 MED ORDER — AMPHETAMINE-DEXTROAMPHETAMINE 20 MG PO TABS: 20.0000 mg | ORAL_TABLET | Freq: Every day | ORAL | 0 refills | Status: DC

## 2016-09-18 NOTE — Telephone Encounter (Signed)
Rec'd call she would like refill on her Adderral for next 3 months...[name]/lmb

## 2016-09-18 NOTE — Telephone Encounter (Signed)
Done hardcopy to [place]  

## 2016-09-20 NOTE — Telephone Encounter (Signed)
Pt has been informed Scripts at front desk

## 2016-10-11 DIAGNOSIS — H5213 Myopia, bilateral: Secondary | ICD-10-CM | POA: Diagnosis not present

## 2016-10-11 DIAGNOSIS — H52223 Regular astigmatism, bilateral: Secondary | ICD-10-CM | POA: Diagnosis not present

## 2016-11-08 ENCOUNTER — Other Ambulatory Visit: Payer: Self-pay | Admitting: Internal Medicine

## 2016-11-08 NOTE — Telephone Encounter (Signed)
Done hardcopy to [place]  

## 2016-11-08 NOTE — Telephone Encounter (Signed)
Check [place] registry last filled 10/10/2016.../lmb  

## 2016-11-09 NOTE — Telephone Encounter (Signed)
[facility]ed [facility].../lmb  

## 2016-12-06 IMAGING — US US THYROID BIOPSY
1 series · 13 of 13 positions shown · non-contrast
Comparison: 09/16/2014

CLINICAL DATA: Dominant partially cystic right midpole thyroid
nodule

EXAM:
ULTRASOUND GUIDED NEEDLE ASPIRATE BIOPSY OF THE THYROID GLAND

[Series 1: us thyroid biopsy · 0.05mm/px · 13 acquisitions, 13 frames shown]
[im 1/13]
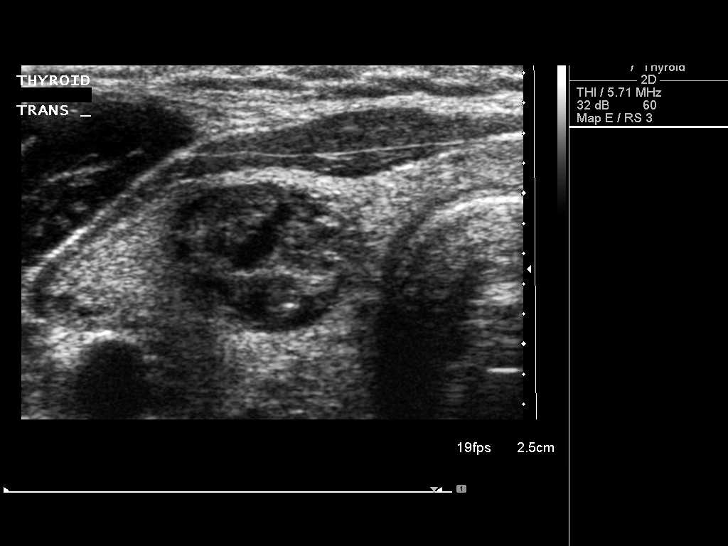
[im 2/13]
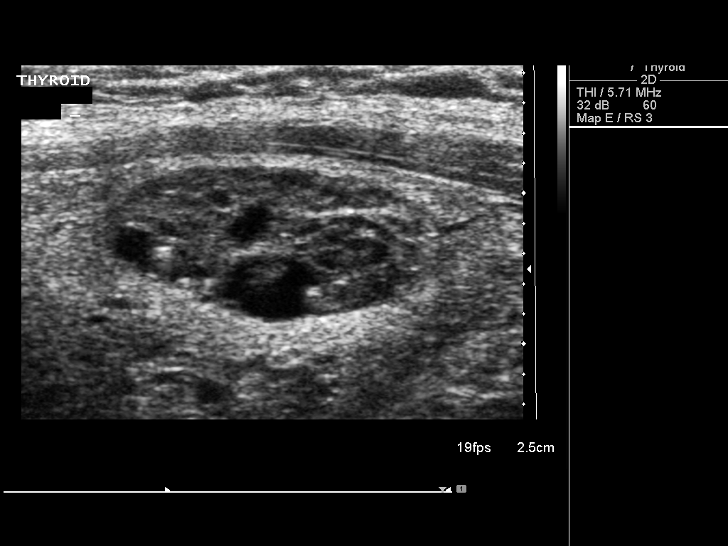
[im 3/13]
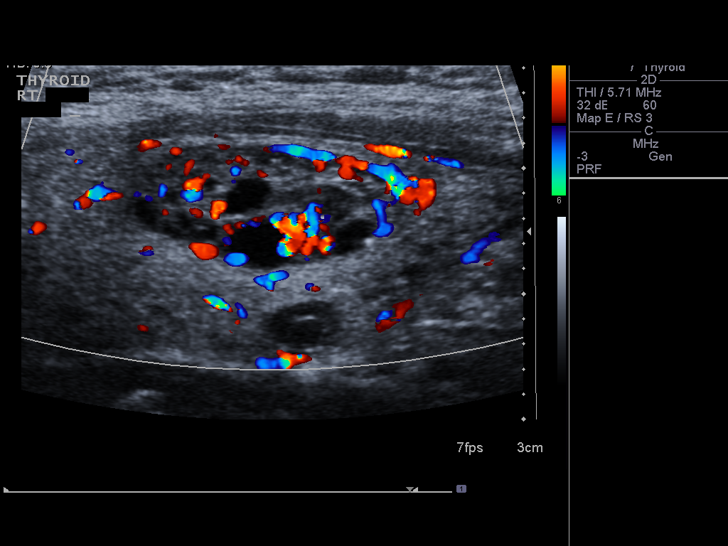
[im 4/13]
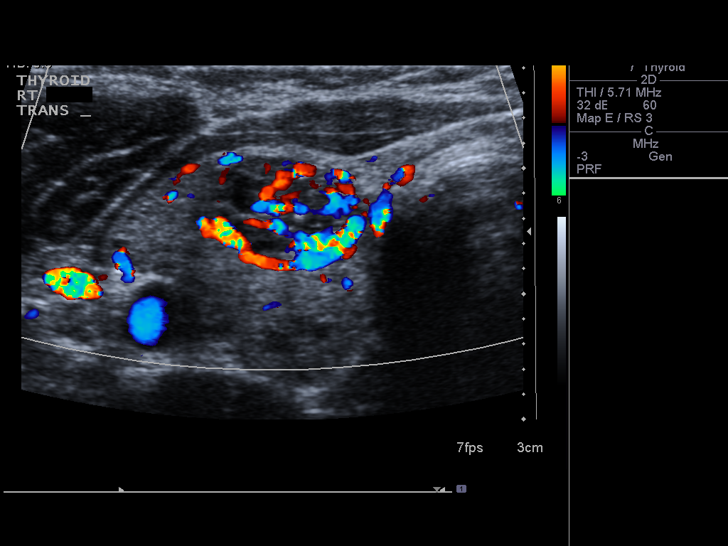
[im 5/13]
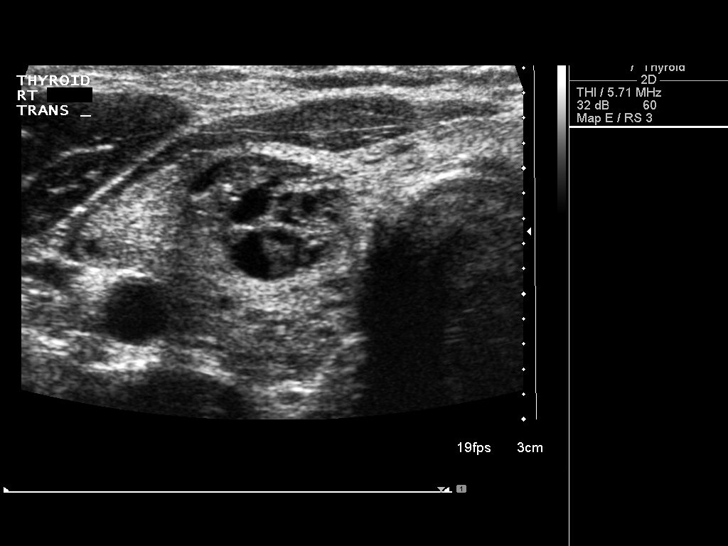
[im 6/13]
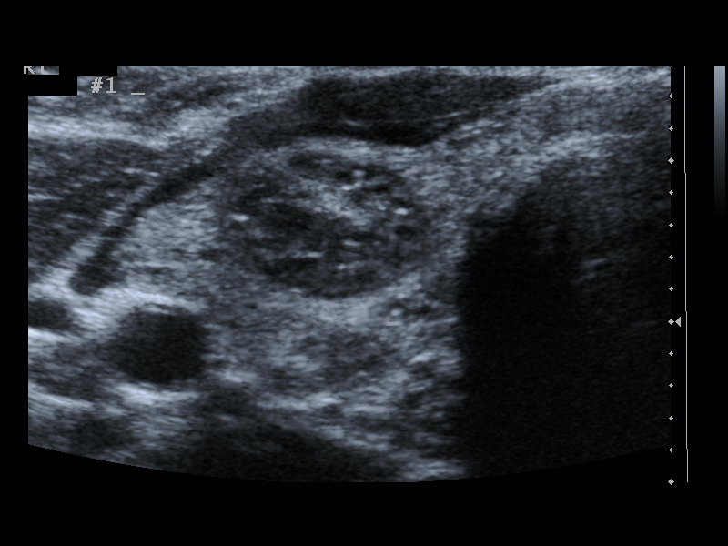
[im 7/13]
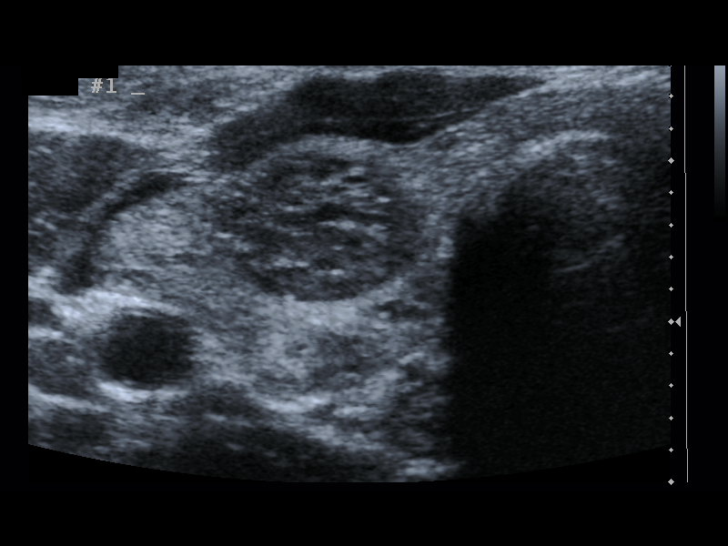
[im 8/13]
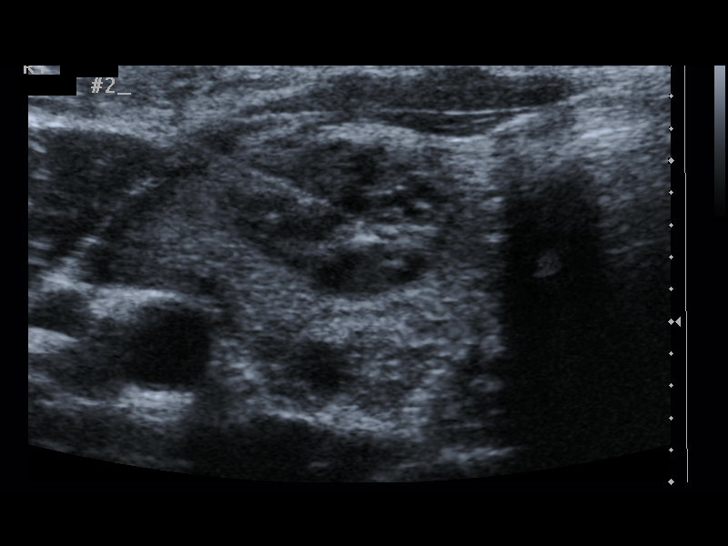
[im 9/13]
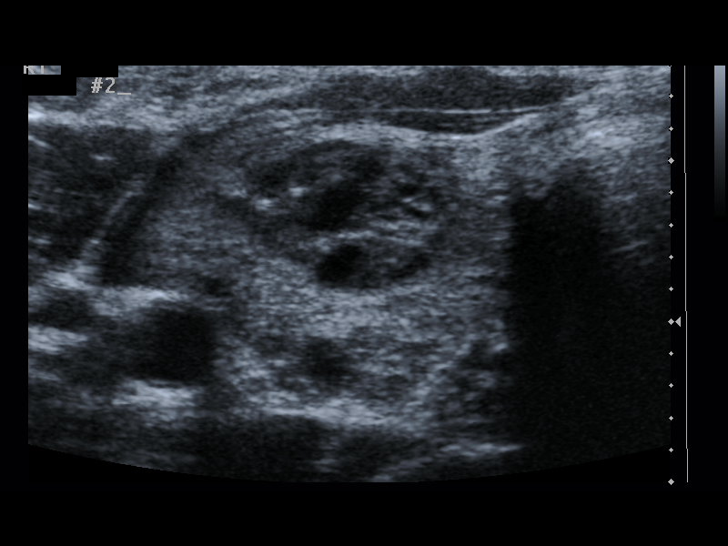
[im 10/13]
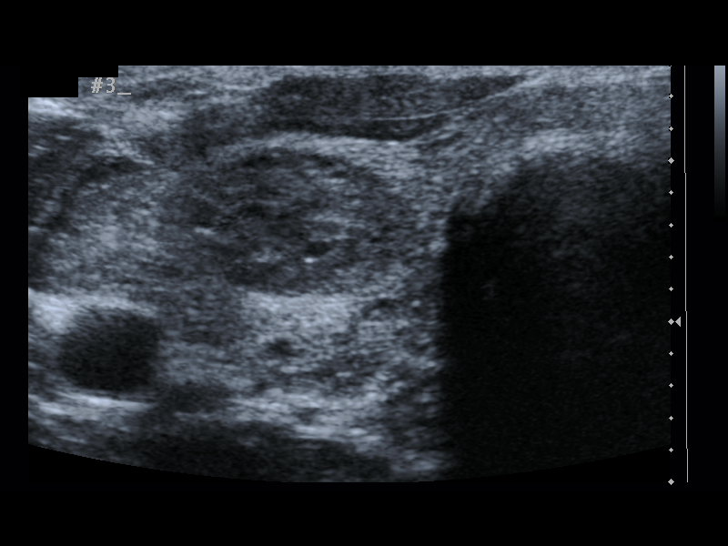
[im 11/13]
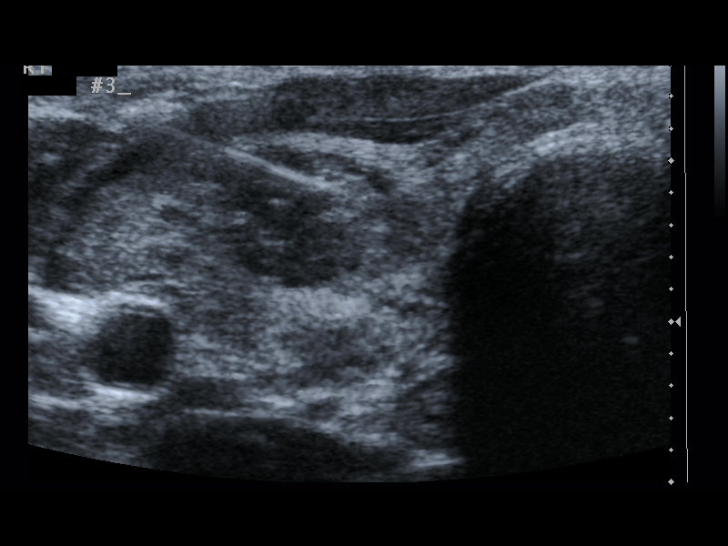
[im 12/13]
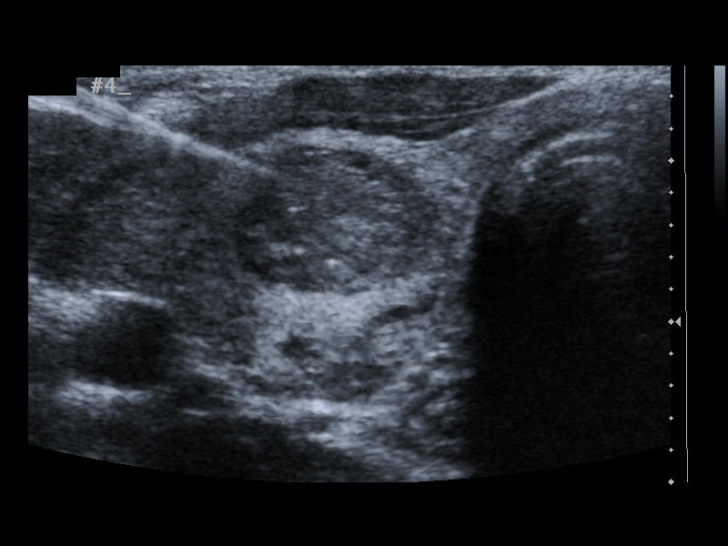
[im 13/13]
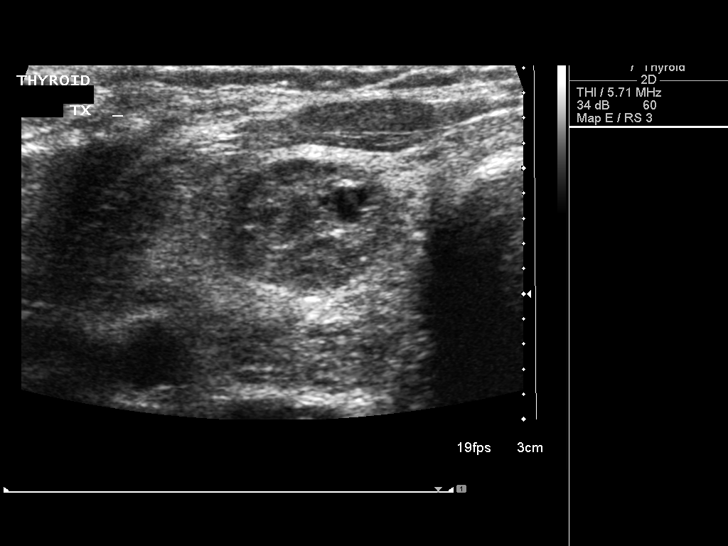

[13 of 13 positions shown; findings below may reference images not displayed]

PROCEDURE:
Thyroid biopsy was thoroughly discussed with the patient and
questions were answered. The benefits, risks, alternatives, and
complications were also discussed. The patient understands and
wishes to proceed with the procedure. Written consent was obtained.

Ultrasound was performed to localize and mark an adequate site for
the biopsy. The patient was then prepped and draped in a normal
sterile fashion. Local anesthesia was provided with 1% lidocaine.
Using direct ultrasound guidance, 4 passes were made using needles
into the nodule within the right lobe of the thyroid. Ultrasound was
used to confirm needle placements on all occasions. Specimens were
sent to Pathology for analysis.

Complications:  None immediate
FINDINGS: Imaging confirms needle placed in in the dominant right midpole
partially cystic nodule.
IMPRESSION: Ultrasound guided needle aspirate biopsy performed of the dominant
right midpole partially cystic thyroid nodule.

## 2016-12-14 ENCOUNTER — Other Ambulatory Visit: Payer: Self-pay | Admitting: Internal Medicine

## 2016-12-14 MED ORDER — AMPHETAMINE-DEXTROAMPHETAMINE 20 MG PO TABS: 20.0000 mg | ORAL_TABLET | Freq: Every day | ORAL | 0 refills | Status: DC

## 2016-12-14 NOTE — Telephone Encounter (Signed)
Called pt, no VM, phone just rings but received a message that caller is not available try again later.   Script at front desk.

## 2016-12-14 NOTE — Telephone Encounter (Signed)
Pt called for a refill of her amphetamine-dextroamphetamine (ADDERALL) 20 MG tablet  Requested 3 month supply  CPE set up for 10/9 Please advise and call back

## 2016-12-14 NOTE — Telephone Encounter (Signed)
Done hardcopy to [place]  

## 2016-12-25 ENCOUNTER — Ambulatory Visit (INDEPENDENT_AMBULATORY_CARE_PROVIDER_SITE_OTHER): Payer: BLUE CROSS/BLUE SHIELD | Admitting: Internal Medicine

## 2016-12-25 ENCOUNTER — Encounter: Payer: Self-pay | Admitting: Internal Medicine

## 2016-12-25 VITALS — BP 110/74 | HR 105 | Temp 98.4°F | Ht 66.0 in | Wt 155.0 lb

## 2016-12-25 DIAGNOSIS — Z23 Encounter for immunization: Secondary | ICD-10-CM | POA: Diagnosis not present

## 2016-12-25 DIAGNOSIS — Z114 Encounter for screening for human immunodeficiency virus [HIV]: Secondary | ICD-10-CM | POA: Diagnosis not present

## 2016-12-25 DIAGNOSIS — Z0000 Encounter for general adult medical examination without abnormal findings: Secondary | ICD-10-CM

## 2016-12-25 MED ORDER — AMPHETAMINE-DEXTROAMPHETAMINE 20 MG PO TABS: 20.0000 mg | ORAL_TABLET | Freq: Every day | ORAL | 0 refills | Status: DC

## 2016-12-25 MED ORDER — AMPHETAMINE-DEXTROAMPHETAMINE 20 MG PO TABS
20.0000 mg | ORAL_TABLET | Freq: Every day | ORAL | 0 refills | Status: DC
Start: 2016-12-25 — End: 2016-12-25

## 2016-12-25 MED ORDER — ALPRAZOLAM 1 MG PO TABS: ORAL_TABLET | ORAL | 1 refills | Status: DC

## 2016-12-25 NOTE — Progress Notes (Signed)
Subjective:    Patient ID: Dana Cervantes, female    DOB: June 05, 1980, 36 y.o.   MRN: [id number]  HPI  Here for wellness and f/u;  Overall doing ok;  Pt denies Chest pain, worsening SOB, DOE, wheezing, orthopnea, PND, worsening LE edema, palpitations, dizziness or syncope.  Pt denies neurological change such as new headache, facial or extremity weakness.  Pt denies polydipsia, polyuria, or low sugar symptoms. Pt states overall good compliance with treatment and medications, good tolerability, and has been trying to follow appropriate diet.  Pt denies worsening depressive symptoms, suicidal ideation or panic. No fever, night sweats, wt loss, loss of appetite, or other constitutional symptoms.  Pt states good ability with ADL's, has low fall risk, home safety reviewed and adequate, no other significant changes in hearing or vision, and only occasionally active with exercise. Klonopin not working as well as xanax.  ADD meds working well. Has gained several lbs  Wt Readings from Last 3 Encounters:  12/25/16 155 lb (70.3 kg)  12/21/15 137 lb (62.1 kg)  05/12/15 151 lb (68.5 kg)  More stress lately. Son has Type I Dm, and other famly stress.  Has GYN appt in November  Declines labs today Past Medical History:  Diagnosis Date  . ADD 11/09/2006  . ALLERGIC RHINITIS 12/08/2008  . ANEMIA-IRON DEFICIENCY 06/03/2008  . ANXIETY 11/09/2006  . DEPRESSION 11/09/2006  . ECZEMA 06/02/2009  . HYPERLIPIDEMIA 06/03/2008  . MIGRAINE HEADACHE 11/09/2006   Past Surgical History:  Procedure Laterality Date  . s/p IUD    . TONSILLECTOMY      reports that she has never smoked. She has never used smokeless tobacco. She reports that she does not drink alcohol or use drugs. family history includes Cancer in her father; Hyperlipidemia in her father. No Known Allergies Current Outpatient Prescriptions on File Prior to Visit  Medication Sig Dispense Refill  . fluticasone (FLONASE) 50 MCG/ACT nasal spray PLACE 2 SPRAYS INTO  BOTH NOSTRILS DAILY. 16 g 3  . triamcinolone cream (KENALOG) 0.1 % Apply 1 application topically 2 (two) times daily. 30 g 1  . escitalopram (LEXAPRO) 10 MG tablet Take 1 tablet (10 mg total) by mouth daily. 90 tablet 3   No current facility-administered medications on file prior to visit.     Review of Systems  Constitutional: Negative for other unusual diaphoresis or sweats HENT: Negative for ear discharge or swelling Eyes: Negative for other worsening visual disturbances Respiratory: Negative for stridor or other swelling  Gastrointestinal: Negative for worsening distension or other blood Genitourinary: Negative for retention or other urinary change Musculoskeletal: Negative for other MSK pain or swelling Skin: Negative for color change or other new lesions Neurological: Negative for worsening tremors and other numbness  Psychiatric/Behavioral: Negative for worsening agitation or other fatigue No other exam findings    Objective:   Physical Exam BP 110/74   Pulse (!) 105   Temp 98.4 F (36.9 C) (Oral)   Ht  (1.676 m)   Wt 155 lb (70.3 kg)   SpO2 99%   BMI 25.02 kg/m  VS noted,  Constitutional: Pt appears in NAD HENT: Head: NCAT.  Right Ear: External ear normal.  Left Ear: External ear normal.  Eyes: . Pupils are equal, round, and reactive to light. Conjunctivae and EOM are normal Nose: without d/c or deformity Neck: Neck supple. Gross normal ROM Cardiovascular: Normal rate and regular rhythm.   Pulmonary/Chest: Effort normal and breath sounds without rales or wheezing.  Abd:  Soft, NT, ND, + BS, no organomegaly Neurological: Pt is alert. At baseline orientation, motor grossly intact Skin: Skin is warm. No rashes, other new lesions, no LE edema Psychiatric: Pt behavior is normal without agitation  No other exam findings    Assessment & Plan:

## 2016-12-25 NOTE — Patient Instructions (Signed)
Ok to change the klonopin to xanax  Please continue all other medications as before, and refills have been done if requested.  Please have the pharmacy call with any other refills you may need.  Please continue your efforts at being more active, low cholesterol diet, and weight control.  You are otherwise up to date with prevention measures today.  Please keep your appointments with your specialists as you may have planned  Please return in 1 year for your yearly visit, or sooner if needed, with Lab testing done 3-5 days before

## 2016-12-25 NOTE — Assessment & Plan Note (Signed)

## 2017-01-28 ENCOUNTER — Other Ambulatory Visit: Payer: Self-pay

## 2017-01-28 MED ORDER — FLUTICASONE PROPIONATE 50 MCG/ACT NA SUSP: NASAL | 3 refills | Status: DC

## 2017-03-08 ENCOUNTER — Telehealth: Payer: Self-pay | Admitting: Internal Medicine

## 2017-03-08 MED ORDER — AMPHETAMINE-DEXTROAMPHETAMINE 20 MG PO TABS
20.0000 mg | ORAL_TABLET | Freq: Every day | ORAL | 0 refills | Status: DC
Start: 2017-03-08 — End: 2017-03-26

## 2017-03-08 NOTE — Telephone Encounter (Signed)
Request refill on Adderall. 

## 2017-03-08 NOTE — Telephone Encounter (Signed)
Called pt no answer LMOM rx sent to [facility].../lmb 

## 2017-03-08 NOTE — Telephone Encounter (Signed)
Refill request for Adderall

## 2017-03-08 NOTE — Telephone Encounter (Signed)
Copied from CRM [id number]. Topic: Quick Communication - See Telephone Encounter >> Mar 08, 2017 12:39 PM Floria RavelingStovall, Shana A wrote: CRM for notification. See Telephone encounter for: pt called in and needs refill on her  amphetamine-dextroamphetamine (ADDERALL) 20 MG tablet [[id number]  [facility] [place] if [facility] is able to be sent  03/08/17.

## 2017-03-08 NOTE — Telephone Encounter (Signed)
Done erx 

## 2017-03-26 ENCOUNTER — Other Ambulatory Visit: Payer: Self-pay | Admitting: Internal Medicine

## 2017-03-26 MED ORDER — AMPHETAMINE-DEXTROAMPHETAMINE 20 MG PO TABS: 20.0000 mg | ORAL_TABLET | Freq: Every day | ORAL | 0 refills | Status: DC

## 2017-03-26 NOTE — Telephone Encounter (Signed)
Done erx 

## 2017-03-26 NOTE — Telephone Encounter (Addendum)
[facility] Pt states she [facility] due to insurance. Updated [facility], and [facility] & [facility].Marland Kitchen.[name]/lmb

## 2017-03-26 NOTE — Telephone Encounter (Signed)
Adderall refill. Last OV 12/25/16. Last refill 03/08/17

## 2017-03-26 NOTE — Telephone Encounter (Signed)
Re done erx

## 2017-03-26 NOTE — Addendum Note (Signed)
Addended by: Corwin LevinsJOHN, Joseph Bias W on: 03/26/2017 01:30 PM   Modules accepted: Orders

## 2017-03-26 NOTE — Telephone Encounter (Signed)
Copied from CRM [id number]. Topic: Quick Communication - Rx Refill/Question >> Mar 26, 2017 12:09 PM Clack, Princella PellegriniJessica D wrote: Has the patient contacted their pharmacy? Yes.     (Agent: If no, request that the patient contact the pharmacy for the refill.)   Preferred Pharmacy (with phone number or street name): [facility] [address], [place], [place] [address] [phone]  Pt is requesting a med refill on her amphetamine-dextroamphetamine (ADDERALL) 20 MG tablet [[id number]][[id number]]   Agent: Please be advised that RX refills may take up to 3 business days. We ask that you follow-up with your pharmacy.

## 2017-04-02 ENCOUNTER — Other Ambulatory Visit: Payer: Self-pay | Admitting: Internal Medicine

## 2017-04-23 DIAGNOSIS — Z01419 Encounter for gynecological examination (general) (routine) without abnormal findings: Secondary | ICD-10-CM | POA: Diagnosis not present

## 2017-04-23 DIAGNOSIS — Z1389 Encounter for screening for other disorder: Secondary | ICD-10-CM | POA: Diagnosis not present

## 2017-04-23 DIAGNOSIS — Z6826 Body mass index (BMI) 26.0-26.9, adult: Secondary | ICD-10-CM | POA: Diagnosis not present

## 2017-04-23 DIAGNOSIS — Z130 Encounter for screening for diseases of the blood and blood-forming organs and certain disorders involving the immune mechanism: Secondary | ICD-10-CM | POA: Diagnosis not present

## 2017-04-25 DIAGNOSIS — Z124 Encounter for screening for malignant neoplasm of cervix: Secondary | ICD-10-CM | POA: Diagnosis not present

## 2017-04-30 ENCOUNTER — Ambulatory Visit: Payer: Self-pay | Admitting: Internal Medicine

## 2017-05-08 DIAGNOSIS — J0100 Acute maxillary sinusitis, unspecified: Secondary | ICD-10-CM | POA: Diagnosis not present

## 2017-05-09 ENCOUNTER — Telehealth: Payer: Self-pay | Admitting: Internal Medicine

## 2017-05-09 MED ORDER — AMPHETAMINE-DEXTROAMPHETAMINE 20 MG PO TABS: 20.0000 mg | ORAL_TABLET | Freq: Every day | ORAL | 0 refills | Status: DC

## 2017-05-09 NOTE — Telephone Encounter (Signed)
Done erx 

## 2017-05-09 NOTE — Telephone Encounter (Signed)
Copied from CRM [id number]. Topic: Quick Communication - See Telephone Encounter >> May 09, 2017  9:25 AM Windy KalataMichael, Hollie Bartus L, NT wrote: CRM for notification. See Telephone encounter for:  05/09/17.  Patient is requesting a refill on Adderall.  [facility] [address] [facility] [place], [place] [address]

## 2017-05-09 NOTE — Telephone Encounter (Signed)
adderall   refill Last OV:  12/25/16 Last Refill: 03/26/17 Pharmacy:   Please review.

## 2017-06-06 ENCOUNTER — Telehealth: Payer: Self-pay | Admitting: Internal Medicine

## 2017-06-06 MED ORDER — AMPHETAMINE-DEXTROAMPHETAMINE 20 MG PO TABS: 20.0000 mg | ORAL_TABLET | Freq: Every day | ORAL | 0 refills | Status: DC

## 2017-06-06 NOTE — Telephone Encounter (Signed)
Copied from CRM [id number]. Topic: Quick Communication - Rx Refill/Question >> Jun 06, 2017  1:18 PM Diana EvesHoyt, Maryann B wrote: Medication: amphetamine-dextroamphetamine (ADDERALL) 20 MG tablet  [facility]erred [facility] (with phone number or street name[facility] [place] [place] - [address]  Agent: Please be advised that RX refills may take up to 3 business days. We ask that you follow-up with your pharmacy.

## 2017-06-06 NOTE — Telephone Encounter (Signed)
Done erx 

## 2017-06-18 ENCOUNTER — Other Ambulatory Visit: Payer: Self-pay | Admitting: Internal Medicine

## 2017-06-19 NOTE — Telephone Encounter (Signed)
Done erx 

## 2017-06-19 NOTE — Telephone Encounter (Signed)
Last filled 03/24/2017 90#

## 2017-07-02 ENCOUNTER — Telehealth: Payer: Self-pay | Admitting: Internal Medicine

## 2017-07-02 MED ORDER — AMPHETAMINE-DEXTROAMPHETAMINE 20 MG PO TABS: 20.0000 mg | ORAL_TABLET | Freq: Every day | ORAL | 0 refills | Status: DC

## 2017-07-02 NOTE — Telephone Encounter (Signed)
Copied from CRM [id number]. Topic: Quick Communication - Rx Refill/Question >> Jul 02, 2017 11:16 AM Diana EvesHoyt, Maryann B wrote: Medication: amphetamine-dextroamphetamine (ADDERALL) 20 MG tablet   [facility] (with phone number or street name[facility] [place] [place] - [address] Agent: Please be advised that RX refills may take up to 3 business days. We ask that you follow-up with your pharmacy.

## 2017-07-02 NOTE — Telephone Encounter (Signed)
Done erx 

## 2017-08-07 ENCOUNTER — Other Ambulatory Visit: Payer: Self-pay | Admitting: Internal Medicine

## 2017-08-07 MED ORDER — AMPHETAMINE-DEXTROAMPHETAMINE 20 MG PO TABS: 20.0000 mg | ORAL_TABLET | Freq: Every day | ORAL | 0 refills | Status: DC

## 2017-08-07 NOTE — Telephone Encounter (Signed)
Refill request for Adderall, last filled on 07/04/17 #30  LOV: 12/25/17 Dr. Suella Grove in [place]

## 2017-08-07 NOTE — Telephone Encounter (Signed)
Copied from CRM [id number]. Topic: Quick Communication - See Telephone Encounter >> Aug 07, 2017 10:12 AM Waymon Amato wrote: Pt is needing a refill on adderral   [facility]  [place]   Best number [phone]

## 2017-08-07 NOTE — Telephone Encounter (Signed)
Done erx 

## 2017-09-10 ENCOUNTER — Other Ambulatory Visit: Payer: Self-pay | Admitting: Internal Medicine

## 2017-09-10 NOTE — Telephone Encounter (Signed)
Copied from CRM [id number]. Topic: Quick Communication - See Telephone Encounter >> Sep 10, 2017  3:26 PM Windy KalataMichael, Julion Gatt L, NT wrote: CRM for notification. See Telephone encounter for: 09/10/17.  Patient is needing a refill on amphetamine-dextroamphetamine (ADDERALL) 20 MG tablet. Please advise.  [facility] [address] - [place], [place] - [address] [place] [address] Phone: [phone] Fax: [phone]

## 2017-09-11 MED ORDER — AMPHETAMINE-DEXTROAMPHETAMINE 20 MG PO TABS: 20.0000 mg | ORAL_TABLET | Freq: Every day | ORAL | 0 refills | Status: DC

## 2017-09-11 NOTE — Telephone Encounter (Signed)
Done erx 

## 2017-09-11 NOTE — Telephone Encounter (Signed)
Refill of adderall  LRF 08/07/17  #30  0 refills  LOV 12/25/16  Dr. Jonny RuizJohn  Pharmacy   [facility] [address] - [place], [place] - [phone] [address]

## 2017-10-05 DIAGNOSIS — N3000 Acute cystitis without hematuria: Secondary | ICD-10-CM | POA: Diagnosis not present

## 2017-10-16 ENCOUNTER — Other Ambulatory Visit: Payer: Self-pay | Admitting: Internal Medicine

## 2017-10-16 MED ORDER — AMPHETAMINE-DEXTROAMPHETAMINE 20 MG PO TABS: 20.0000 mg | ORAL_TABLET | Freq: Every day | ORAL | 0 refills | Status: DC

## 2017-10-16 NOTE — Telephone Encounter (Signed)
Adderall refill Last Refill:09/11/17 #30 Last OV: 12/25/16 PCP: Dr. Jonny RuizJohn [facility]  [address]; [place]

## 2017-10-16 NOTE — Telephone Encounter (Signed)
Done erx 

## 2017-10-16 NOTE — Telephone Encounter (Signed)
Copied from CRM [id number]. Topic: Quick Communication - Rx Refill/Question >> Oct 16, 2017 10:08 AM Marylen PontoMcneil, Ja-Kwan wrote: Medication: amphetamine-dextroamphetamine (ADDERALL) 20 MG tablet  Has the patient contacted their pharmacy? no   Preferred Pharmacy (with phone number or street name): [facility] [address] - [place], [place] - [address] [phone] (Phone) [phone] (Fax)   Agent: Please be advised that RX refills may take up to 3 business days. We ask that you follow-up with your pharmacy.

## 2017-11-12 ENCOUNTER — Telehealth: Payer: Self-pay | Admitting: Internal Medicine

## 2017-11-12 MED ORDER — AMPHETAMINE-DEXTROAMPHETAMINE 20 MG PO TABS: 20.0000 mg | ORAL_TABLET | Freq: Every day | ORAL | 0 refills | Status: DC

## 2017-11-12 NOTE — Telephone Encounter (Signed)
MD approved and sent electronically to [facility]../lmb  

## 2017-11-12 NOTE — Telephone Encounter (Signed)
Done erx 

## 2017-11-12 NOTE — Telephone Encounter (Signed)
Copied from CRM [id number]. Topic: Quick Communication - Rx Refill/Question >> Nov 12, 2017  1:52 PM Alexander BergeronBarksdale, Harvey B wrote: Medication: amphetamine-dextroamphetamine (ADDERALL) 20 MG tablet [[id number]][[id number]]   Has the patient contacted their pharmacy? Yes.   (Agent: If no, request that the patient contact the pharmacy for the refill.) (Agent: If yes, when and what did the pharmacy advise?)  [facility]  Agent: Please be advised that RX refills may take up to 3 business days. We ask that you follow-up with your pharmacy.

## 2017-12-11 ENCOUNTER — Other Ambulatory Visit: Payer: Self-pay | Admitting: Internal Medicine

## 2017-12-11 MED ORDER — AMPHETAMINE-DEXTROAMPHETAMINE 20 MG PO TABS: 20.0000 mg | ORAL_TABLET | Freq: Every day | ORAL | 0 refills | Status: DC

## 2017-12-11 MED ORDER — ALPRAZOLAM 1 MG PO TABS: ORAL_TABLET | ORAL | 1 refills | Status: DC

## 2017-12-11 NOTE — Addendum Note (Signed)
Addended by: Berton Lan R on: 12/11/2017 10:45 AM   Modules accepted: Orders

## 2017-12-11 NOTE — Telephone Encounter (Signed)
adderall refill Last Refill:11/12/17 # 30 Last OV: 12/25/16 PCP: Dr Oliver Barre Pharmacy: [facility], [place]  alprazolam refill Last Refill:06/19/17 # 90 Last OV: 12/25/16 PCP: Dr Oliver Barre Pharmacy: [facility], [place]

## 2017-12-11 NOTE — Addendum Note (Signed)
Addended by: Corwin Levins on: 12/11/2017 12:19 PM   Modules accepted: Orders

## 2017-12-11 NOTE — Telephone Encounter (Signed)
Copied from CRM [id number]. Topic: Quick Communication - Rx Refill/Question >> Dec 11, 2017  9:34 AM Alexander Bergeron B wrote: Medication: amphetamine-dextroamphetamine (ADDERALL) 20 MG tablet [[id number]]  ALPRAZolam Prudy Feeler) 1 MG tablet [[id number]] -Partial refill until appt on the 15th  Has the patient contacted their pharmacy? Yes.   (Agent: If no, request that the patient contact the pharmacy for the refill.) (Agent: If yes, when and what did the pharmacy advise?)  [facility]  Agent: Please be advised that RX refills may take up to 3 business days. We ask that you follow-up with your pharmacy.

## 2017-12-11 NOTE — Telephone Encounter (Signed)
Done erx 

## 2017-12-11 NOTE — Telephone Encounter (Signed)
Control database checked last refill: Adderall 11/12/2017 Xanax: 09/16/2017

## 2017-12-30 ENCOUNTER — Ambulatory Visit (INDEPENDENT_AMBULATORY_CARE_PROVIDER_SITE_OTHER): Payer: BLUE CROSS/BLUE SHIELD | Admitting: Internal Medicine

## 2017-12-30 ENCOUNTER — Encounter: Payer: Self-pay | Admitting: Internal Medicine

## 2017-12-30 VITALS — BP 122/84 | HR 108 | Temp 98.4°F | Ht 66.0 in | Wt 157.0 lb

## 2017-12-30 DIAGNOSIS — Z0000 Encounter for general adult medical examination without abnormal findings: Secondary | ICD-10-CM

## 2017-12-30 DIAGNOSIS — Z114 Encounter for screening for human immunodeficiency virus [HIV]: Secondary | ICD-10-CM | POA: Diagnosis not present

## 2017-12-30 DIAGNOSIS — F411 Generalized anxiety disorder: Secondary | ICD-10-CM | POA: Diagnosis not present

## 2017-12-30 DIAGNOSIS — Z23 Encounter for immunization: Secondary | ICD-10-CM

## 2017-12-30 DIAGNOSIS — F988 Other specified behavioral and emotional disorders with onset usually occurring in childhood and adolescence: Secondary | ICD-10-CM | POA: Diagnosis not present

## 2017-12-30 NOTE — Progress Notes (Signed)
Subjective:    Patient ID: Dana Cervantes, female    DOB: 1980/05/05, 37 y.o.   MRN: [id number]  HPI  Here for wellness and f/u;  Overall doing ok;  Pt denies Chest pain, worsening SOB, DOE, wheezing, orthopnea, PND, worsening LE edema, palpitations, dizziness or syncope.  Pt denies neurological change such as new headache, facial or extremity weakness.  Pt denies polydipsia, polyuria, or low sugar symptoms. Pt states overall good compliance with treatment and medications, good tolerability, and has been trying to follow appropriate diet.  Pt denies worsening depressive symptoms, suicidal ideation or panic. No fever, night sweats, wt loss, loss of appetite, or other constitutional symptoms.  Pt states good ability with ADL's, has low fall risk, home safety reviewed and adequate, no other significant changes in hearing or vision, and only occasionally active with exercise.  No new complaints Past Medical History:  Diagnosis Date  . ADD 11/09/2006  . ALLERGIC RHINITIS 12/08/2008  . ANEMIA-IRON DEFICIENCY 06/03/2008  . ANXIETY 11/09/2006  . DEPRESSION 11/09/2006  . ECZEMA 06/02/2009  . HYPERLIPIDEMIA 06/03/2008  . MIGRAINE HEADACHE 11/09/2006   Past Surgical History:  Procedure Laterality Date  . s/p IUD    . TONSILLECTOMY      reports that she has never smoked. She has never used smokeless tobacco. She reports that she does not drink alcohol or use drugs. family history includes Cancer in her father; Hyperlipidemia in her father. No Known Allergies Current Outpatient Medications on File Prior to Visit  Medication Sig Dispense Refill  . ALPRAZolam (XANAX) 1 MG tablet TAKE 1/2 TO 1 (ONE-HALF TO ONE) TABLET BY MOUTH ONCE DAILY AS NEEDED 90 tablet 1  . amphetamine-dextroamphetamine (ADDERALL) 20 MG tablet Take 1 tablet (20 mg total) by mouth daily. 30 tablet 0  . fluticasone (FLONASE) 50 MCG/ACT nasal spray SPRAY 2 SPRAYS INTO EACH NOSTRIL EVERY DAY 16 g 3  . triamcinolone cream (KENALOG) 0.1 %  Apply 1 application topically 2 (two) times daily. 30 g 1  . escitalopram (LEXAPRO) 10 MG tablet Take 1 tablet (10 mg total) by mouth daily. 90 tablet 3   No current facility-administered medications on file prior to visit.    Review of Systems Constitutional: Negative for other unusual diaphoresis, sweats, appetite or weight changes HENT: Negative for other worsening hearing loss, ear pain, facial swelling, mouth sores or neck stiffness.   Eyes: Negative for other worsening pain, redness or other visual disturbance.  Respiratory: Negative for other stridor or swelling Cardiovascular: Negative for other palpitations or other chest pain  Gastrointestinal: Negative for worsening diarrhea or loose stools, blood in stool, distention or other pain Genitourinary: Negative for hematuria, flank pain or other change in urine volume.  Musculoskeletal: Negative for myalgias or other joint swelling.  Skin: Negative for other color change, or other wound or worsening drainage.  Neurological: Negative for other syncope or numbness. Hematological: Negative for other adenopathy or swelling Psychiatric/Behavioral: Negative for hallucinations, other worsening agitation, SI, self-injury, or new decreased concentration All other system neg per pt    Objective:   Physical Exam BP 122/84   Pulse (!) 108   Temp 98.4 F (36.9 C) (Oral)   Ht 5\' 6"  (1.676 m)   Wt 157 lb (71.2 kg)   SpO2 97%   BMI 25.34 kg/m  VS noted,  Constitutional: Pt is oriented to person, place, and time. Appears well-developed and well-nourished, in no significant distress and comfortable Head: Normocephalic and atraumatic  Eyes: Conjunctivae and EOM  are normal. Pupils are equal, round, and reactive to light Right Ear: External ear normal without discharge Left Ear: External ear normal without discharge Nose: Nose without discharge or deformity Mouth/Throat: Oropharynx is without other ulcerations and moist  Neck: Normal range of  motion. Neck supple. No JVD present. No tracheal deviation present or significant neck LA or mass Cardiovascular: Normal rate, regular rhythm, normal heart sounds and intact distal pulses.   Pulmonary/Chest: WOB normal and breath sounds without rales or wheezing  Abdominal: Soft. Bowel sounds are normal. NT. No HSM  Musculoskeletal: Normal range of motion. Exhibits no edema Lymphadenopathy: Has no other cervical adenopathy.  Neurological: Pt is alert and oriented to person, place, and time. Pt has normal reflexes. No cranial nerve deficit. Motor grossly intact, Gait intact Skin: Skin is warm and dry. No rash noted or new ulcerations Psychiatric:  Has normal mood and affect. Behavior is normal without agitation No other exam findings  Lab Results  Component Value Date   WBC 6.7 12/21/2015   HGB 13.2 12/21/2015   HCT 37.7 12/21/2015   PLT 247.0 12/21/2015   GLUCOSE 77 12/21/2015   CHOL 156 12/21/2015   TRIG 109.0 12/21/2015   HDL 82.40 12/21/2015   LDLCALC 52 12/21/2015   ALT 20 12/21/2015   AST 18 12/21/2015   NA 135 12/21/2015   K 3.9 12/21/2015   CL 99 12/21/2015   CREATININE 0.52 12/21/2015   BUN 11 12/21/2015   CO2 29 12/21/2015   TSH 1.02 12/21/2015   Declines further labs today    Assessment & Plan:

## 2017-12-30 NOTE — Assessment & Plan Note (Signed)

## 2017-12-30 NOTE — Assessment & Plan Note (Signed)
Stable, cont same tx 

## 2017-12-30 NOTE — Patient Instructions (Addendum)
You had the flu shot today  Please continue all other medications as before, and refills have been done if requested.  Please have the pharmacy call with any other refills you may need.  Please continue your efforts at being more active, low cholesterol diet, and weight control.  You are otherwise up to date with prevention measures today.  Please keep your appointments with your specialists as you may have planned  Please return in 1 year for your yearly visit, or sooner if needed 

## 2017-12-31 ENCOUNTER — Other Ambulatory Visit: Payer: Self-pay | Admitting: Internal Medicine

## 2018-01-08 ENCOUNTER — Other Ambulatory Visit: Payer: Self-pay | Admitting: Internal Medicine

## 2018-01-08 MED ORDER — AMPHETAMINE-DEXTROAMPHETAMINE 20 MG PO TABS: 20.0000 mg | ORAL_TABLET | Freq: Every day | ORAL | 0 refills | Status: DC

## 2018-01-08 NOTE — Telephone Encounter (Signed)
Check [facility] registry last filled 12/11/2017. MD is out of the office for 2 weeks pls advise.Marland KitchenRaechel Chute.

## 2018-01-08 NOTE — Telephone Encounter (Signed)
Copied from CRM [id number]. Topic: Quick Communication - Rx Refill/Question >> Jan 08, 2018 11:04 AM Luanna Cole wrote: Medication:amphetamine-dextroamphetamine (ADDERALL) 20 MG tablet [[id number]]   Has the patient contacted their pharmacy?  Preferred Pharmacy (with phone number or street name): [facility] [address], [place] [address] [phone] (Phone) [phone] (Fax)  Agent: Please be advised that RX refills may take up to 3 business days. We ask that you follow-up with your pharmacy.

## 2018-01-08 NOTE — Telephone Encounter (Signed)
Requested medication (s) are due for refill today: yes  Requested medication (s) are on the active medication list: yes  Last refill:  12/11/17 #30  Future visit scheduled: No  Notes to clinic:      Requested Prescriptions  Pending Prescriptions Disp Refills   amphetamine-dextroamphetamine (ADDERALL) 20 MG tablet 30 tablet 0    Sig: Take 1 tablet (20 mg total) by mouth daily.     Not Delegated - Psychiatry:  Stimulants/ADHD Failed - 01/08/2018 11:12 AM      Failed - This refill cannot be delegated      Failed - Urine Drug Screen completed in last 360 days.      Passed - Valid encounter within last 3 months    Recent Outpatient Visits          1 week ago Preventative health care   [facility] -Clair Gulling, MD   1 year ago Preventative health care   [facility] -Clair Gulling, MD   2 years ago Encounter for well adult exam with abnormal findings   [facility] -Clair Gulling, MD   2 years ago Dysuria   [facility] -Jaclyn Shaggy, Len Blalock, MD   3 years ago Preventative health care   [facility] -Clair Gulling, MD

## 2018-01-08 NOTE — Telephone Encounter (Signed)
MD approved and sent electronically to [facility]../lmb  

## 2018-02-07 ENCOUNTER — Other Ambulatory Visit: Payer: Self-pay | Admitting: Internal Medicine

## 2018-02-07 MED ORDER — AMPHETAMINE-DEXTROAMPHETAMINE 20 MG PO TABS: 20.0000 mg | ORAL_TABLET | Freq: Every day | ORAL | 0 refills | Status: DC

## 2018-02-07 NOTE — Telephone Encounter (Signed)
Requested medication (s) are due for refill today: yes  Requested medication (s) are on the active medication list: yes  Last refill:  01/08/18  Future visit scheduled: no  Notes to clinic:      Requested Prescriptions  Pending Prescriptions Disp Refills   amphetamine-dextroamphetamine (ADDERALL) 20 MG tablet 30 tablet 0    Sig: Take 1 tablet (20 mg total) by mouth daily.     Not Delegated - Psychiatry:  Stimulants/ADHD Failed - 02/07/2018  9:05 AM      Failed - This refill cannot be delegated      Failed - Urine Drug Screen completed in last 360 days.      Passed - Valid encounter within last 3 months    Recent Outpatient Visits          1 month ago Preventative health care   [facility] -Clair GullingElam John, James W, MD   1 year ago Preventative health care   [facility] -Clair GullingElam John, James W, MD   2 years ago Encounter for well adult exam with abnormal findings   [facility] -Clair GullingElam John, James W, MD   2 years ago Dysuria   [facility] -Jaclyn ShaggyElam John, Len BlalockJames W, MD   3 years ago Preventative health care   [facility] -Clair GullingElam John, James W, MD

## 2018-02-07 NOTE — Telephone Encounter (Signed)
Copied from CRM [id number] [id number]. Topic: Quick Communication - Rx Refill/Question >> Feb 07, 2018  8:53 AM Fanny BienIlderton, Jessica L wrote: Medication: amphetamine-dextroamphetamine (ADDERALL) 20 MG tablet [[id number]][[id number]]   Has the patient contacted their pharmacy? no Preferred Pharmacy (with phone number or street name): [facility] [address] - [place], [place] [address] [phone] (Phone) [phone])    Agent: Please be advised that RX refills may take up to 3 business days. We ask that you follow-up with your pharmacy.

## 2018-02-07 NOTE — Telephone Encounter (Signed)
Done erx 

## 2018-02-25 ENCOUNTER — Other Ambulatory Visit: Payer: Self-pay | Admitting: Internal Medicine

## 2018-03-04 ENCOUNTER — Other Ambulatory Visit: Payer: Self-pay | Admitting: Internal Medicine

## 2018-03-04 MED ORDER — AMPHETAMINE-DEXTROAMPHETAMINE 20 MG PO TABS: 20.0000 mg | ORAL_TABLET | Freq: Every day | ORAL | 0 refills | Status: DC

## 2018-03-04 NOTE — Telephone Encounter (Signed)
Copied from CRM [id number]. Topic: Quick Communication - Rx Refill/Question >> Mar 04, 2018  1:26 PM Dana HoughJohnson, Dana E wrote: Medication: amphetamine-dextroamphetamine (ADDERALL) 20 MG tablet  Has the patient contacted their pharmacy? No.  Preferred Pharmacy (with phone number or street name): [facility] [address] - [place], [place] [address] [phone] (Phone) [phone])    Agent: Please be advised that RX refills may take up to 3 business days. We ask that you follow-up with your pharmacy.

## 2018-03-04 NOTE — Telephone Encounter (Signed)
Requested medication (s) are due for refill today: yes  Requested medication (s) are on the active medication list: yes    Last refill: 02/07/18  #30  0 refills  Future visit scheduled no  Notes to clinic:not delegated  Requested Prescriptions  Pending Prescriptions Disp Refills   amphetamine-dextroamphetamine (ADDERALL) 20 MG tablet 30 tablet 0    Sig: Take 1 tablet (20 mg total) by mouth daily.     Not Delegated - Psychiatry:  Stimulants/ADHD Failed - 03/04/2018  1:31 PM      Failed - This refill cannot be delegated      Failed - Urine Drug Screen completed in last 360 days.      Passed - Valid encounter within last 3 months    Recent Outpatient Visits          2 months ago Preventative health care   [facility] -Clair GullingElam John, James W, MD   1 year ago Preventative health care   [facility] -Clair GullingElam John, James W, MD   2 years ago Encounter for well adult exam with abnormal findings   [facility] -Clair Gull[facility]lam John, James W, MD   2 years ago Dysuria   [facility] -Jaclyn ShaggyElam John, Len BlalockJames W, MD   3 years ago Preventative health care   [facility] -Clair GullingElam John, James W, MD

## 2018-03-04 NOTE — Telephone Encounter (Signed)
Done erx 

## 2018-04-04 ENCOUNTER — Telehealth: Payer: Self-pay | Admitting: Internal Medicine

## 2018-04-04 MED ORDER — AMPHETAMINE-DEXTROAMPHETAMINE 20 MG PO TABS: 20.0000 mg | ORAL_TABLET | Freq: Every day | ORAL | 0 refills | Status: DC

## 2018-04-04 NOTE — Telephone Encounter (Signed)
Done erx 

## 2018-04-04 NOTE — Telephone Encounter (Signed)
Copied from CRM [id number]. Topic: Quick Communication - Rx Refill/Question >> Apr 04, 2018 10:32 AM Maia Petties wrote: Medication: amphetamine-dextroamphetamine (ADDERALL) 20 MG tablet  will be out on 04/07/2018  Has the patient contacted their pharmacy? No - controlled and states she has to call Preferred Pharmacy (with phone number or street name): [facility] [address], [place] [address] [phone] (Phone) [phone] (Fax)

## 2018-05-05 ENCOUNTER — Other Ambulatory Visit: Payer: Self-pay | Admitting: Internal Medicine

## 2018-05-05 MED ORDER — AMPHETAMINE-DEXTROAMPHETAMINE 20 MG PO TABS: 20.0000 mg | ORAL_TABLET | Freq: Every day | ORAL | 0 refills | Status: DC

## 2018-05-05 NOTE — Telephone Encounter (Signed)
Done erx 

## 2018-05-05 NOTE — Telephone Encounter (Signed)
[place] Controlled Substance Database checked. Last filled on 04/05/18  Last OV 12/30/17

## 2018-05-05 NOTE — Telephone Encounter (Unsigned)
Copied from CRM [id number]. Topic: Quick Communication - Rx Refill/Question >> May 05, 2018  8:47 AM Gaynelle Adu wrote: Medication: amphetamine-dextroamphetamine (ADDERALL) 20 MG tablet  Has the patient contacted their pharmacy? No   Preferred Pharmacy (with phone number or street name): [facility] [address], [place] - [address] [phone] (Phone) [phone] (Fax)    Agent: Please be advised that RX refills may take up to 3 business days. We ask that you follow-up with your pharmacy.

## 2018-05-21 DIAGNOSIS — Z01419 Encounter for gynecological examination (general) (routine) without abnormal findings: Secondary | ICD-10-CM | POA: Diagnosis not present

## 2018-05-21 DIAGNOSIS — Z1389 Encounter for screening for other disorder: Secondary | ICD-10-CM | POA: Diagnosis not present

## 2018-05-21 DIAGNOSIS — Z30433 Encounter for removal and reinsertion of intrauterine contraceptive device: Secondary | ICD-10-CM | POA: Diagnosis not present

## 2018-05-21 DIAGNOSIS — Z6826 Body mass index (BMI) 26.0-26.9, adult: Secondary | ICD-10-CM | POA: Diagnosis not present

## 2018-06-09 ENCOUNTER — Other Ambulatory Visit: Payer: Self-pay | Admitting: Internal Medicine

## 2018-06-09 ENCOUNTER — Telehealth: Payer: Self-pay | Admitting: Internal Medicine

## 2018-06-09 MED ORDER — AMPHETAMINE-DEXTROAMPHETAMINE 20 MG PO TABS: 20.0000 mg | ORAL_TABLET | Freq: Every day | ORAL | 0 refills | Status: DC

## 2018-06-09 MED ORDER — ALPRAZOLAM 1 MG PO TABS: ORAL_TABLET | ORAL | 1 refills | Status: DC

## 2018-06-09 NOTE — Addendum Note (Signed)
Addended by: Corwin Levins on: 06/09/2018 04:34 PM   Modules accepted: Orders

## 2018-06-09 NOTE — Telephone Encounter (Signed)
Done erx 

## 2018-06-09 NOTE — Telephone Encounter (Signed)
Copied from CRM [id number]. Topic: Quick Communication - Rx Refill/Question >> Jun 09, 2018  2:49 PM Reggie Pile, NT wrote: Medication:  ALPRAZolam Prudy Feeler) 1 MG tablet   Has the patient contacted their pharmacy? No. Patient requesting refill. Last office visit 12/30/17. Last filled 12/11/17.  Preferred Pharmacy (with phone number or street name):  [facility] [address], [place] [address] [phone] (Phone) [phone] (Fax)  Agent: Please be advised that RX refills may take up to 3 business days. We ask that you follow-up with your pharmacy.

## 2018-07-18 ENCOUNTER — Other Ambulatory Visit: Payer: Self-pay | Admitting: Internal Medicine

## 2018-07-18 MED ORDER — AMPHETAMINE-DEXTROAMPHETAMINE 20 MG PO TABS: 20.0000 mg | ORAL_TABLET | Freq: Every day | ORAL | 0 refills | Status: DC

## 2018-07-18 NOTE — Telephone Encounter (Signed)
Done erx 

## 2018-08-19 ENCOUNTER — Other Ambulatory Visit: Payer: Self-pay | Admitting: Internal Medicine

## 2018-08-19 MED ORDER — AMPHETAMINE-DEXTROAMPHETAMINE 20 MG PO TABS: 20.0000 mg | ORAL_TABLET | Freq: Every day | ORAL | 0 refills | Status: DC

## 2018-08-19 NOTE — Telephone Encounter (Signed)
Done erx 

## 2018-09-29 ENCOUNTER — Other Ambulatory Visit: Payer: Self-pay | Admitting: Internal Medicine

## 2018-09-29 MED ORDER — AMPHETAMINE-DEXTROAMPHETAMINE 20 MG PO TABS: 20.0000 mg | ORAL_TABLET | Freq: Every day | ORAL | 0 refills | Status: DC

## 2018-09-29 NOTE — Telephone Encounter (Signed)
Done erx 

## 2018-10-13 DIAGNOSIS — Z20828 Contact with and (suspected) exposure to other viral communicable diseases: Secondary | ICD-10-CM | POA: Diagnosis not present

## 2018-10-24 ENCOUNTER — Telehealth: Payer: BC Managed Care – PPO | Admitting: Physician Assistant

## 2018-10-24 DIAGNOSIS — R300 Dysuria: Secondary | ICD-10-CM | POA: Diagnosis not present

## 2018-10-24 MED ORDER — NITROFURANTOIN MONOHYD MACRO 100 MG PO CAPS: 100.0000 mg | ORAL_CAPSULE | Freq: Two times a day (BID) | ORAL | 0 refills | Status: AC

## 2018-10-24 NOTE — Progress Notes (Signed)
We are sorry that you are not feeling well.  Here is how we plan to help!  Based on what you shared with me it looks like you most likely have a simple urinary tract infection.  A UTI (Urinary Tract Infection) is a bacterial infection of the bladder.  Most cases of urinary tract infections are simple to treat but a key part of your care is to encourage you to drink plenty of fluids and watch your symptoms carefully.  I have prescribed MacroBid 100 mg twice a day for 5 days.  Your symptoms should gradually improve. Call us if the burning in your urine worsens, you develop worsening fever, back pain or pelvic pain or if your symptoms do not resolve after completing the antibiotic.  Urinary tract infections can be prevented by drinking plenty of water to keep your body hydrated.  Also be sure when you wipe, wipe from front to back and don't hold it in!  If possible, empty your bladder every 4 hours.  Your e-visit answers were reviewed by a board certified advanced clinical practitioner to complete your personal care plan.  Depending on the condition, your plan could have included both over the counter or prescription medications.  If there is a problem please reply  once you have received a response from your provider.  Your safety is important to us.  If you have drug allergies check your prescription carefully.    You can use MyChart to ask questions about today's visit, request a non-urgent call back, or ask for a work or school excuse for 24 hours related to this e-Visit. If it has been greater than 24 hours you will need to follow up with your provider, or enter a new e-Visit to address those concerns.   You will get an e-mail in the next two days asking about your experience.  I hope that your e-visit has been valuable and will speed your recovery. Thank you for using e-visits.  I have spent 5 minutes in review of e-visit questionnaire, review and updating patient chart, medical decision  making and response to patient.    Sayvion Vigen, PA-C    

## 2018-10-29 ENCOUNTER — Other Ambulatory Visit: Payer: Self-pay | Admitting: Internal Medicine

## 2018-10-29 MED ORDER — AMPHETAMINE-DEXTROAMPHETAMINE 20 MG PO TABS: 20.0000 mg | ORAL_TABLET | Freq: Every day | ORAL | 0 refills | Status: DC

## 2018-10-29 NOTE — Telephone Encounter (Signed)
Done erx 

## 2018-11-07 ENCOUNTER — Telehealth: Payer: BLUE CROSS/BLUE SHIELD | Admitting: Nurse Practitioner

## 2018-11-07 DIAGNOSIS — R300 Dysuria: Secondary | ICD-10-CM

## 2018-11-07 NOTE — Progress Notes (Signed)
Based on what you shared with me it looks like you have recurrent uti,that should be evaluated in a face to face office visit. Since this is recurrent you will need ot see your PCP to have a urinalysis and urine cullture done.   NOTE: If you entered your credit card information for this eVisit, you will not be charged. You may see a "hold" on your card for the $30 but that hold will drop off and you will not have a charge processed.  If you are having a true medical emergency please call 911.  If you need an urgent face to face visit, [place] has four urgent care centers for your convenience.  If you need care fast and have a high deductible or no insurance consider:   [email or web address] to reserve your spot online an avoid wait times  [facility] [address] [place], [place] [address] 8 am to 8 pm Monday-Friday 10 am to 4 pm Saturday-Sunday *Across the street from [employer]  [place], [address] 8 am to 5 pm Monday-Friday * In the [facility] on the [address]   The following sites will take your  insurance:  . [facility] [place] a Provider at this Location  [address] [place], [place] [address] . 10 am to 8 pm Monday-Friday . 12 pm to 8 pm Saturday-Sunday   . [facility] [place] a Provider at this Location  [address], [place], [place] [address] . 8 am to 8 pm Monday-Friday . 9 am to 6 pm Saturday . 11 am to 6 pm Sunday   . [facility] [place] Get Driving Directions  [address] [place], [place] [address] . 8 am to 8 pm Monday-Friday . 8 am to 4 pm Saturday-Sunday   Your e-visit answers were reviewed by a board certified advanced clinical practitioner to complete your personal  care plan.

## 2018-11-10 ENCOUNTER — Ambulatory Visit: Payer: BLUE CROSS/BLUE SHIELD | Admitting: Internal Medicine

## 2018-12-01 ENCOUNTER — Other Ambulatory Visit: Payer: Self-pay | Admitting: Internal Medicine

## 2018-12-01 MED ORDER — AMPHETAMINE-DEXTROAMPHETAMINE 20 MG PO TABS: 20.0000 mg | ORAL_TABLET | Freq: Every day | ORAL | 0 refills | Status: DC

## 2018-12-01 NOTE — Telephone Encounter (Signed)
Done erx 

## 2018-12-02 ENCOUNTER — Other Ambulatory Visit: Payer: Self-pay | Admitting: Internal Medicine

## 2018-12-02 NOTE — Telephone Encounter (Signed)
Done erx  Please let pt know- due for return office visit for further refills

## 2019-01-01 ENCOUNTER — Encounter: Payer: BC Managed Care – PPO | Admitting: Internal Medicine

## 2019-01-02 ENCOUNTER — Other Ambulatory Visit: Payer: Self-pay | Admitting: Internal Medicine

## 2019-01-02 MED ORDER — AMPHETAMINE-DEXTROAMPHETAMINE 20 MG PO TABS: 20.0000 mg | ORAL_TABLET | Freq: Every day | ORAL | 0 refills | Status: DC

## 2019-01-02 NOTE — Telephone Encounter (Signed)
Done erx 

## 2019-01-08 ENCOUNTER — Ambulatory Visit (INDEPENDENT_AMBULATORY_CARE_PROVIDER_SITE_OTHER): Payer: BC Managed Care – PPO | Admitting: Internal Medicine

## 2019-01-08 ENCOUNTER — Other Ambulatory Visit (INDEPENDENT_AMBULATORY_CARE_PROVIDER_SITE_OTHER): Payer: BC Managed Care – PPO

## 2019-01-08 ENCOUNTER — Encounter: Payer: Self-pay | Admitting: Internal Medicine

## 2019-01-08 ENCOUNTER — Other Ambulatory Visit: Payer: Self-pay

## 2019-01-08 ENCOUNTER — Other Ambulatory Visit: Payer: Self-pay | Admitting: Internal Medicine

## 2019-01-08 VITALS — BP 148/88 | HR 123 | Temp 99.1°F | Ht 66.0 in | Wt 164.0 lb

## 2019-01-08 DIAGNOSIS — Z23 Encounter for immunization: Secondary | ICD-10-CM | POA: Diagnosis not present

## 2019-01-08 DIAGNOSIS — R7989 Other specified abnormal findings of blood chemistry: Secondary | ICD-10-CM

## 2019-01-08 DIAGNOSIS — Z114 Encounter for screening for human immunodeficiency virus [HIV]: Secondary | ICD-10-CM | POA: Diagnosis not present

## 2019-01-08 DIAGNOSIS — Z0000 Encounter for general adult medical examination without abnormal findings: Secondary | ICD-10-CM | POA: Diagnosis not present

## 2019-01-08 DIAGNOSIS — R945 Abnormal results of liver function studies: Secondary | ICD-10-CM

## 2019-01-08 DIAGNOSIS — Z0001 Encounter for general adult medical examination with abnormal findings: Secondary | ICD-10-CM

## 2019-01-08 DIAGNOSIS — J309 Allergic rhinitis, unspecified: Secondary | ICD-10-CM

## 2019-01-08 DIAGNOSIS — F988 Other specified behavioral and emotional disorders with onset usually occurring in childhood and adolescence: Secondary | ICD-10-CM | POA: Diagnosis not present

## 2019-01-08 DIAGNOSIS — F411 Generalized anxiety disorder: Secondary | ICD-10-CM | POA: Diagnosis not present

## 2019-01-08 LAB — CBC WITH DIFFERENTIAL/PLATELET
Basophils Absolute: 0 10*3/uL (ref 0.0–0.1)
Basophils Relative: 0.3 % (ref 0.0–3.0)
Eosinophils Absolute: 0.1 10*3/uL (ref 0.0–0.7)
Eosinophils Relative: 1.8 % (ref 0.0–5.0)
HCT: 39.8 % (ref 36.0–46.0)
Hemoglobin: 13.9 g/dL (ref 12.0–15.0)
Lymphocytes Relative: 27.2 % (ref 12.0–46.0)
Lymphs Abs: 2.2 10*3/uL (ref 0.7–4.0)
MCHC: 35 g/dL (ref 30.0–36.0)
MCV: 98.6 fl (ref 78.0–100.0)
Monocytes Absolute: 0.5 10*3/uL (ref 0.1–1.0)
Monocytes Relative: 6.7 % (ref 3.0–12.0)
Neutro Abs: 5.1 10*3/uL (ref 1.4–7.7)
Neutrophils Relative %: 64 % (ref 43.0–77.0)
Platelets: 291 10*3/uL (ref 150.0–400.0)
RBC: 4.04 Mil/uL (ref 3.87–5.11)
RDW: 12.9 % (ref 11.5–15.5)
WBC: 7.9 10*3/uL (ref 4.0–10.5)

## 2019-01-08 LAB — LIPID PANEL
Cholesterol: 234 mg/dL — ABNORMAL HIGH (ref 0–200)
HDL: 82 mg/dL (ref >39.00)
LDL Cholesterol: 134 mg/dL — ABNORMAL HIGH (ref 0–99)
NonHDL: 151.59
Total CHOL/HDL Ratio: 3
Triglycerides: 87 mg/dL (ref 0.0–149.0)
VLDL: 17.4 mg/dL (ref 0.0–40.0)

## 2019-01-08 LAB — URINALYSIS, ROUTINE W REFLEX MICROSCOPIC
Hgb urine dipstick: NEGATIVE
Ketones, ur: 40 — AB
Leukocytes,Ua: NEGATIVE
Nitrite: NEGATIVE
Specific Gravity, Urine: >=1.03 — AB (ref 1.000–1.030)
Urine Glucose: NEGATIVE
Urobilinogen, UA: 0.2 (ref 0.0–1.0)
pH: 5.5 (ref 5.0–8.0)

## 2019-01-08 LAB — TSH: TSH: 0.95 u[IU]/mL (ref 0.35–4.50)

## 2019-01-08 LAB — HEPATIC FUNCTION PANEL
ALT: 122 U/L — ABNORMAL HIGH (ref 0–35)
AST: 173 U/L — ABNORMAL HIGH (ref 0–37)
Albumin: 5.1 g/dL (ref 3.5–5.2)
Alkaline Phosphatase: 46 U/L (ref 39–117)
Bilirubin, Direct: 0.3 mg/dL (ref 0.0–0.3)
Total Bilirubin: 1.8 mg/dL — ABNORMAL HIGH (ref 0.2–1.2)
Total Protein: 7.9 g/dL (ref 6.0–8.3)

## 2019-01-08 LAB — BASIC METABOLIC PANEL
BUN: 14 mg/dL (ref 6–23)
CO2: 25 mEq/L (ref 19–32)
Calcium: 9.8 mg/dL (ref 8.4–10.5)
Chloride: 97 mEq/L (ref 96–112)
Creatinine, Ser: 0.69 mg/dL (ref 0.40–1.20)
GFR: 96.24 mL/min (ref >60.00)
Glucose, Bld: 85 mg/dL (ref 70–99)
Potassium: 3.8 mEq/L (ref 3.5–5.1)
Sodium: 135 mEq/L (ref 135–145)

## 2019-01-08 MED ORDER — TRIAMCINOLONE ACETONIDE 55 MCG/ACT NA AERO: 2.0000 | INHALATION_SPRAY | Freq: Every day | NASAL | 3 refills | Status: DC

## 2019-01-08 MED ORDER — CITALOPRAM HYDROBROMIDE 20 MG PO TABS: 20.0000 mg | ORAL_TABLET | Freq: Every day | ORAL | 3 refills | Status: DC

## 2019-01-08 MED ORDER — ALPRAZOLAM 1 MG PO TABS: ORAL_TABLET | ORAL | 5 refills | Status: DC

## 2019-01-08 NOTE — Assessment & Plan Note (Signed)
Mod to severe, for celexa 20 qd, and xanax prn to continue

## 2019-01-08 NOTE — Assessment & Plan Note (Signed)

## 2019-01-08 NOTE — Progress Notes (Signed)
Subjective:    Patient ID: Dana Cervantes, female    DOB: [birth year], 38 y.o.   MRN: [id number]  HPI  Here for wellness and f/u;  Overall doing ok;  Pt denies Chest pain, worsening SOB, DOE, wheezing, orthopnea, PND, worsening LE edema, palpitations, dizziness or syncope.  Pt denies neurological change such as new headache, facial or extremity weakness.  Pt denies polydipsia, polyuria, or low sugar symptoms. Pt states overall good compliance with treatment and medications, good tolerability, and has been trying to follow appropriate diet.  No fever, night sweats, wt loss, loss of appetite, or other constitutional symptoms.  Pt states good ability with ADL's, has low fall risk, home safety reviewed and adequate, no other significant changes in hearing or vision, and only occasionally active with exercise.  BP at home < 140/90.   BP Readings from Last 3 Encounters:  01/08/19 (!) 148/88  12/30/17 122/84  12/25/16 110/74   Wt Readings from Last 3 Encounters:  01/08/19 164 lb (74.4 kg)  12/30/17 157 lb (71.2 kg)  12/25/16 155 lb (70.3 kg)  Does have several wks ongoing nasal allergy symptoms with clearish congestion, itch and sneezing, without fever, pain, ST, cough, swelling or wheezing. Denies worsening depressive symptoms, suicidal ideation, or panic; has ongoing anxiety, increased recently due to multiple stressors.   Past Medical History:  Diagnosis Date  . ADD 11/09/2006  . ALLERGIC RHINITIS 12/08/2008  . ANEMIA-IRON DEFICIENCY 06/03/2008  . ANXIETY 11/09/2006  . DEPRESSION 11/09/2006  . ECZEMA 06/02/2009  . HYPERLIPIDEMIA 06/03/2008  . MIGRAINE HEADACHE 11/09/2006   Past Surgical History:  Procedure Laterality Date  . s/p IUD    . TONSILLECTOMY      reports that she has never smoked. She has never used smokeless tobacco. She reports that she does not drink alcohol or use drugs. family history includes Cancer in her father; Hyperlipidemia in her father. No Known Allergies Current  Outpatient Medications on File Prior to Visit  Medication Sig Dispense Refill  . amphetamine-dextroamphetamine (ADDERALL) 20 MG tablet Take 1 tablet (20 mg total) by mouth daily. 30 tablet 0  . triamcinolone cream (KENALOG) 0.1 % Apply 1 application topically 2 (two) times daily. 30 g 1   No current facility-administered medications on file prior to visit.    Review of Systems Constitutional: Negative for other unusual diaphoresis, sweats, appetite or weight changes HENT: Negative for other worsening hearing loss, ear pain, facial swelling, mouth sores or neck stiffness.   Eyes: Negative for other worsening pain, redness or other visual disturbance.  Respiratory: Negative for other stridor or swelling Cardiovascular: Negative for other palpitations or other chest pain  Gastrointestinal: Negative for worsening diarrhea or loose stools, blood in stool, distention or other pain Genitourinary: Negative for hematuria, flank pain or other change in urine volume.  Musculoskeletal: Negative for myalgias or other joint swelling.  Skin: Negative for other color change, or other wound or worsening drainage.  Neurological: Negative for other syncope or numbness. Hematological: Negative for other adenopathy or swelling Psychiatric/Behavioral: Negative for hallucinations, other worsening agitation, SI, self-injury, or new decreased concentration All otherwise neg per pt.allj     Objective:   Physical Exam BP (!) 148/88 (BP Location: Left Arm, Patient Position: Sitting, Cuff Size: Normal)   Pulse (!) 123   Temp 99.1 F (37.3 C) (Oral)   Ht 5\' 6"  (1.676 m)   Wt 164 lb (74.4 kg)   LMP 12/14/2018 (Within Days)   SpO2 98%   BMI  26.47 kg/m  VS noted,  Constitutional: Pt is oriented to person, place, and time. Appears well-developed and well-nourished, in no significant distress and comfortable Head: Normocephalic and atraumatic  Eyes: Conjunctivae and EOM are normal. Pupils are equal, round, and  reactive to light Bilat tm's with mild erythema.  Max sinus areas non tender.  Pharynx with mild erythema, no exudate Right Ear: External ear normal without discharge Left Ear: External ear normal without discharge Nose: Nose without discharge or deformity Mouth/Throat: Oropharynx is without other ulcerations and moist  Neck: Normal range of motion. Neck supple. No JVD present. No tracheal deviation present or significant neck LA or mass Cardiovascular: Normal rate, regular rhythm, normal heart sounds and intact distal pulses.   Pulmonary/Chest: WOB normal and breath sounds without rales or wheezing  Abdominal: Soft. Bowel sounds are normal. NT. No HSM  Musculoskeletal: Normal range of motion. Exhibits no edema Lymphadenopathy: Has no other cervical adenopathy.  Neurological: Pt is alert and oriented to person, place, and time. Pt has normal reflexes. No cranial nerve deficit. Motor grossly intact, Gait intact Skin: Skin is warm and dry. No rash noted or new ulcerations Psychiatric:  Has nervous mood and affect. Behavior is normal without agitation, 2+ nervous All otherwise neg per pt  Lab Results  Component Value Date   WBC 7.9 01/08/2019   HGB 13.9 01/08/2019   HCT 39.8 01/08/2019   PLT 291.0 01/08/2019   GLUCOSE 85 01/08/2019   CHOL 234 (H) 01/08/2019   TRIG 87.0 01/08/2019   HDL 82.00 01/08/2019   LDLCALC 134 (H) 01/08/2019   ALT 122 (H) 01/08/2019   AST 173 (H) 01/08/2019   NA 135 01/08/2019   K 3.8 01/08/2019   CL 97 01/08/2019   CREATININE 0.69 01/08/2019   BUN 14 01/08/2019   CO2 25 01/08/2019   TSH 0.95 01/08/2019      Assessment & Plan:

## 2019-01-08 NOTE — Assessment & Plan Note (Signed)
stable overall by history and exam, recent data reviewed with pt, and pt to continue medical treatment as before,  to f/u any worsening symptoms or concerns  

## 2019-01-08 NOTE — Patient Instructions (Addendum)
You had the flu shot today, and Tdap tetanus shot today  Ok to change the flonase to nasacort for allergies  Please continue to monitor your BP on a regular basis with the goal less than 140/90  Please take all new medication as prescribed - the celexa 20 mg per day  Please continue all other medications as before, and refills have been done if requested.  Please have the pharmacy call with any other refills you may need.  Please continue your efforts at being more active, low cholesterol diet, and weight control.  Please keep your appointments with your specialists as you may have planned  Please go to the LAB in the Basement (turn left off the elevator) for the tests to be done today  You will be contacted by phone if any changes need to be made immediately.  Otherwise, you will receive a letter about your results with an explanation, but please check with MyChart first.  Please remember to sign up for MyChart if you have not done so, as this will be important to you in the future with finding out test results, communicating by private email, and scheduling acute appointments online when needed.  Please return in 1 year for your yearly visit, or sooner if needed

## 2019-01-08 NOTE — Assessment & Plan Note (Signed)
Mild to mod, for nasaocrt asd, to f/u any worsening symptoms or concerns

## 2019-01-09 LAB — HIV ANTIBODY (ROUTINE TESTING W REFLEX): HIV 1&2 Ab, 4th Generation: NONREACTIVE

## 2019-01-23 ENCOUNTER — Encounter: Payer: Self-pay | Admitting: Internal Medicine

## 2019-02-13 ENCOUNTER — Other Ambulatory Visit: Payer: Self-pay | Admitting: Internal Medicine

## 2019-02-16 MED ORDER — AMPHETAMINE-DEXTROAMPHETAMINE 20 MG PO TABS: 20.0000 mg | ORAL_TABLET | Freq: Every day | ORAL | 0 refills | Status: DC

## 2019-02-16 NOTE — Telephone Encounter (Signed)
Done erx 

## 2019-02-18 ENCOUNTER — Encounter: Payer: Self-pay | Admitting: Internal Medicine

## 2019-02-18 DIAGNOSIS — R945 Abnormal results of liver function studies: Secondary | ICD-10-CM

## 2019-02-18 DIAGNOSIS — R7989 Other specified abnormal findings of blood chemistry: Secondary | ICD-10-CM

## 2019-02-26 ENCOUNTER — Other Ambulatory Visit (INDEPENDENT_AMBULATORY_CARE_PROVIDER_SITE_OTHER): Payer: BC Managed Care – PPO

## 2019-02-26 DIAGNOSIS — R945 Abnormal results of liver function studies: Secondary | ICD-10-CM

## 2019-02-26 DIAGNOSIS — R7989 Other specified abnormal findings of blood chemistry: Secondary | ICD-10-CM

## 2019-02-26 LAB — HEPATIC FUNCTION PANEL
ALT: 26 U/L (ref 0–35)
AST: 18 U/L (ref 0–37)
Albumin: 4.4 g/dL (ref 3.5–5.2)
Alkaline Phosphatase: 31 U/L — ABNORMAL LOW (ref 39–117)
Bilirubin, Direct: 0.1 mg/dL (ref 0.0–0.3)
Total Bilirubin: 0.6 mg/dL (ref 0.2–1.2)
Total Protein: 7 g/dL (ref 6.0–8.3)

## 2019-03-23 ENCOUNTER — Other Ambulatory Visit: Payer: Self-pay | Admitting: Internal Medicine

## 2019-03-24 MED ORDER — AMPHETAMINE-DEXTROAMPHETAMINE 20 MG PO TABS: 20.0000 mg | ORAL_TABLET | Freq: Every day | ORAL | 0 refills | Status: DC

## 2019-03-24 NOTE — Telephone Encounter (Signed)
Done erx 

## 2019-04-29 ENCOUNTER — Other Ambulatory Visit: Payer: Self-pay | Admitting: Internal Medicine

## 2019-04-30 MED ORDER — AMPHETAMINE-DEXTROAMPHETAMINE 20 MG PO TABS: 20.0000 mg | ORAL_TABLET | Freq: Every day | ORAL | 0 refills | Status: DC

## 2019-04-30 NOTE — Telephone Encounter (Signed)
Done erx 

## 2019-05-27 ENCOUNTER — Other Ambulatory Visit: Payer: Self-pay | Admitting: Internal Medicine

## 2019-05-27 MED ORDER — AMPHETAMINE-DEXTROAMPHETAMINE 20 MG PO TABS: 20.0000 mg | ORAL_TABLET | Freq: Every day | ORAL | 0 refills | Status: DC

## 2019-05-27 NOTE — Telephone Encounter (Signed)
Done erx 

## 2019-06-24 ENCOUNTER — Other Ambulatory Visit: Payer: Self-pay | Admitting: Internal Medicine

## 2019-06-24 MED ORDER — AMPHETAMINE-DEXTROAMPHETAMINE 20 MG PO TABS: 20.0000 mg | ORAL_TABLET | Freq: Every day | ORAL | 0 refills | Status: DC

## 2019-06-24 NOTE — Telephone Encounter (Signed)
Done erx 

## 2019-07-23 DIAGNOSIS — E669 Obesity, unspecified: Secondary | ICD-10-CM | POA: Diagnosis not present

## 2019-07-23 DIAGNOSIS — Z01419 Encounter for gynecological examination (general) (routine) without abnormal findings: Secondary | ICD-10-CM | POA: Diagnosis not present

## 2019-07-23 DIAGNOSIS — Z130 Encounter for screening for diseases of the blood and blood-forming organs and certain disorders involving the immune mechanism: Secondary | ICD-10-CM | POA: Diagnosis not present

## 2019-07-23 DIAGNOSIS — Z1389 Encounter for screening for other disorder: Secondary | ICD-10-CM | POA: Diagnosis not present

## 2019-07-27 ENCOUNTER — Other Ambulatory Visit: Payer: Self-pay | Admitting: Internal Medicine

## 2019-07-27 MED ORDER — AMPHETAMINE-DEXTROAMPHETAMINE 20 MG PO TABS: 20.0000 mg | ORAL_TABLET | Freq: Every day | ORAL | 0 refills | Status: DC

## 2019-07-27 NOTE — Telephone Encounter (Signed)
Done erx 

## 2019-08-19 ENCOUNTER — Telehealth: Payer: Self-pay | Admitting: Internal Medicine

## 2019-08-19 NOTE — Telephone Encounter (Signed)
Reviewed chart pt is up-to-date sent refills to 05/27/2019. MD is out of the office pls advise.Marland KitchenRaechel Chute

## 2019-08-19 NOTE — Telephone Encounter (Signed)
    1.Medication Requested:ALPRAZolam (XANAX) 1 MG tablet  2. Pharmacy (Name, Street, [address]):[facility] [address], [place] [address]  3. On Med List: yes  4. Last Visit with PCP: 12/2018  5. Next visit date with PCP:06/15//21   Agent: Please be advised that RX refills may take up to 3 business days. We ask that you follow-up with your pharmacy.

## 2019-08-20 MED ORDER — ALPRAZOLAM 1 MG PO TABS: ORAL_TABLET | ORAL | 1 refills | Status: DC

## 2019-08-20 NOTE — Telephone Encounter (Signed)
Okay.  Thanks.

## 2019-09-01 ENCOUNTER — Other Ambulatory Visit: Payer: Self-pay | Admitting: Internal Medicine

## 2019-09-01 ENCOUNTER — Ambulatory Visit: Payer: BC Managed Care – PPO | Admitting: Internal Medicine

## 2019-09-01 MED ORDER — AMPHETAMINE-DEXTROAMPHETAMINE 20 MG PO TABS: 20.0000 mg | ORAL_TABLET | Freq: Every day | ORAL | 0 refills | Status: DC

## 2019-09-01 NOTE — Telephone Encounter (Signed)
Done erx 

## 2019-09-16 ENCOUNTER — Ambulatory Visit: Payer: BC Managed Care – PPO | Admitting: Internal Medicine

## 2019-09-16 ENCOUNTER — Other Ambulatory Visit: Payer: Self-pay

## 2019-09-16 ENCOUNTER — Encounter: Payer: Self-pay | Admitting: Internal Medicine

## 2019-09-16 VITALS — BP 100/80 | HR 107 | Temp 98.6°F | Ht 66.0 in | Wt 158.0 lb

## 2019-09-16 DIAGNOSIS — Z0000 Encounter for general adult medical examination without abnormal findings: Secondary | ICD-10-CM

## 2019-09-16 DIAGNOSIS — F411 Generalized anxiety disorder: Secondary | ICD-10-CM

## 2019-09-16 MED ORDER — ALPRAZOLAM 1 MG PO TABS: ORAL_TABLET | ORAL | 2 refills | Status: DC

## 2019-09-16 MED ORDER — CITALOPRAM HYDROBROMIDE 20 MG PO TABS: 20.0000 mg | ORAL_TABLET | Freq: Every day | ORAL | 3 refills | Status: DC

## 2019-09-16 MED ORDER — TRIAMCINOLONE ACETONIDE 55 MCG/ACT NA AERO: 2.0000 | INHALATION_SPRAY | Freq: Every day | NASAL | 3 refills | Status: DC

## 2019-09-16 MED ORDER — TRIAMCINOLONE ACETONIDE 0.1 % EX CREA: 1.0000 "application " | TOPICAL_CREAM | Freq: Two times a day (BID) | CUTANEOUS | 1 refills | Status: DC

## 2019-09-16 NOTE — Assessment & Plan Note (Signed)
stable overall by history and exam, recent data reviewed with pt, and pt to continue medical treatment as before,  to f/u any worsening symptoms or concerns  

## 2019-09-16 NOTE — Patient Instructions (Signed)
Please continue all other medications as before, and refills have been done if requested.  Please have the pharmacy call with any other refills you may need.  Please continue your efforts at being more active, low cholesterol diet, and weight control.  You are otherwise up to date with prevention measures today.  Please keep your appointments with your specialists as you may have planned  Please go to the LAB at the blood drawing area for the tests to be done at your convenience  You will be contacted by phone if any changes need to be made immediately.  Otherwise, you will receive a letter about your results with an explanation, but please check with MyChart first.  Please remember to sign up for MyChart if you have not done so, as this will be important to you in the future with finding out test results, communicating by private email, and scheduling acute appointments online when needed.  Please make an Appointment to return for your 1 year visit, or sooner if needed, with Lab testing by Appointment as well, to be done about 3-5 days before at the FIRST FLOOR Lab (so this is for TWO appointments - please see the scheduling desk as you leave)  

## 2019-09-16 NOTE — Progress Notes (Signed)
Subjective:    Patient ID: Dana Cervantes, female    DOB: 1981/01/01, 39 y.o.   MRN: [id number]  HPI  Here for wellness and f/u;  Overall doing ok;  Pt denies Chest pain, worsening SOB, DOE, wheezing, orthopnea, PND, worsening LE edema, palpitations, dizziness or syncope.  Pt denies neurological change such as new headache, facial or extremity weakness.  Pt denies polydipsia, polyuria, or low sugar symptoms. Pt states overall good compliance with treatment and medications, good tolerability, and has been trying to follow appropriate diet.  Pt denies worsening depressive symptoms, suicidal ideation or panic. No fever, night sweats, wt loss, loss of appetite, or other constitutional symptoms.  Pt states good ability with ADL's, has low fall risk, home safety reviewed and adequate, no other significant changes in hearing or vision, and only occasionally active with exercise.  Denies worsening depressive symptoms, suicidal ideation, or panic; has ongoing anxiety, asks for med refills Past Medical History:  Diagnosis Date  . ADD 11/09/2006  . ALLERGIC RHINITIS 12/08/2008  . ANEMIA-IRON DEFICIENCY 06/03/2008  . ANXIETY 11/09/2006  . DEPRESSION 11/09/2006  . ECZEMA 06/02/2009  . HYPERLIPIDEMIA 06/03/2008  . MIGRAINE HEADACHE 11/09/2006   Past Surgical History:  Procedure Laterality Date  . s/p IUD    . TONSILLECTOMY      reports that she has never smoked. She has never used smokeless tobacco. She reports that she does not drink alcohol and does not use drugs. family history includes Cancer in her father; Hyperlipidemia in her father. No Known Allergies Current Outpatient Medications on File Prior to Visit  Medication Sig Dispense Refill  . amphetamine-dextroamphetamine (ADDERALL) 20 MG tablet Take 1 tablet (20 mg total) by mouth daily. 30 tablet 0  . levonorgestrel (MIRENA, 52 MG,) 20 MCG/24HR IUD Mirena 20 mcg/24 hours (6 yrs) 52 mg intrauterine device  Take 1 device by intrauterine route.     No  current facility-administered medications on file prior to visit.   Review of Systems All otherwise neg per pt    Objective:   Physical Exam BP 100/80 (BP Location: Left Arm, Patient Position: Sitting, Cuff Size: Large)   Pulse (!) 107   Temp 98.6 F (37 C) (Oral)   Ht 5\' 6"  (1.676 m)   Wt 158 lb (71.7 kg)   SpO2 96%   BMI 25.50 kg/m  VS noted,  Constitutional: Pt appears in NAD HENT: Head: NCAT.  Right Ear: External ear normal.  Left Ear: External ear normal.  Eyes: . Pupils are equal, round, and reactive to light. Conjunctivae and EOM are normal Nose: without d/c or deformity Neck: Neck supple. Gross normal ROM Cardiovascular: Normal rate and regular rhythm.   Pulmonary/Chest: Effort normal and breath sounds without rales or wheezing.  Abd:  Soft, NT, ND, + BS, no organomegaly Neurological: Pt is alert. At baseline orientation, motor grossly intact Skin: Skin is warm. No rashes, other new lesions, no LE edema Psychiatric: Pt behavior is normal without agitation  All otherwise neg per pt Lab Results  Component Value Date   WBC 7.9 01/08/2019   HGB 13.9 01/08/2019   HCT 39.8 01/08/2019   PLT 291.0 01/08/2019   GLUCOSE 85 01/08/2019   CHOL 234 (H) 01/08/2019   TRIG 87.0 01/08/2019   HDL 82.00 01/08/2019   LDLCALC 134 (H) 01/08/2019   ALT 26 02/26/2019   AST 18 02/26/2019   NA 135 01/08/2019   K 3.8 01/08/2019   CL 97 01/08/2019   CREATININE 0.69 01/08/2019  BUN 14 01/08/2019   CO2 25 01/08/2019   TSH 0.95 01/08/2019         Assessment & Plan:

## 2019-09-16 NOTE — Assessment & Plan Note (Signed)

## 2019-09-24 ENCOUNTER — Other Ambulatory Visit: Payer: Self-pay | Admitting: Internal Medicine

## 2019-09-25 MED ORDER — AMPHETAMINE-DEXTROAMPHETAMINE 20 MG PO TABS: 20.0000 mg | ORAL_TABLET | Freq: Every day | ORAL | 0 refills | Status: DC

## 2019-09-25 NOTE — Telephone Encounter (Signed)
Done erx 

## 2019-10-20 DIAGNOSIS — Z20822 Contact with and (suspected) exposure to covid-19: Secondary | ICD-10-CM | POA: Diagnosis not present

## 2019-11-01 ENCOUNTER — Other Ambulatory Visit: Payer: Self-pay | Admitting: Internal Medicine

## 2019-11-02 ENCOUNTER — Other Ambulatory Visit: Payer: Self-pay | Admitting: Internal Medicine

## 2019-11-02 MED ORDER — AMPHETAMINE-DEXTROAMPHETAMINE 20 MG PO TABS: 20.0000 mg | ORAL_TABLET | Freq: Every day | ORAL | 0 refills | Status: DC

## 2019-11-02 NOTE — Telephone Encounter (Signed)
Done erx 

## 2019-11-02 NOTE — Telephone Encounter (Signed)
Already done aug 16

## 2019-11-03 NOTE — Telephone Encounter (Signed)
Same answer

## 2019-12-02 ENCOUNTER — Other Ambulatory Visit: Payer: Self-pay | Admitting: Internal Medicine

## 2019-12-02 MED ORDER — AMPHETAMINE-DEXTROAMPHETAMINE 20 MG PO TABS: 20.0000 mg | ORAL_TABLET | Freq: Every day | ORAL | 0 refills | Status: DC

## 2019-12-02 NOTE — Telephone Encounter (Signed)
Done erx 

## 2019-12-17 ENCOUNTER — Telehealth: Payer: Self-pay | Admitting: Internal Medicine

## 2019-12-17 NOTE — Telephone Encounter (Signed)
Patient was triaged through [facility] by Selena Batten and she told them her BP was 149/106, dizzy, headache, hot, nauseated. She was advised to go to the ER but refused and wants to be seen in office.     Dana Cervantes can be reached at [phone]

## 2019-12-17 NOTE — Telephone Encounter (Signed)
Pt states her BP at 1230 was 132/89, & now (while talking on the phone) 148/104.  Denies symptoms from this morning.  States she does not want to go to ER & has made an appt with [facility] for tomorrow so that she sees a provider.   Will route to Dr Jonny Ruiz for further recommendation.

## 2019-12-17 NOTE — Telephone Encounter (Signed)
May have mild HTN - ok for OV at her convenience next available

## 2019-12-17 NOTE — Telephone Encounter (Signed)
Patient called and said that she is experiencing high blood pressure. She said the latest reading was 140/90, she said she was also dizzy, hot, nauseated and had a headache. She was transferred to  [facility] at  [facility]

## 2019-12-18 DIAGNOSIS — Z0131 Encounter for examination of blood pressure with abnormal findings: Secondary | ICD-10-CM | POA: Diagnosis not present

## 2019-12-18 NOTE — Telephone Encounter (Signed)
LVM instructing pt to keep appt scheduled with Vernona Rieger for 12/22/19 & to call office if ques/concerns.

## 2019-12-22 ENCOUNTER — Other Ambulatory Visit: Payer: Self-pay

## 2019-12-22 ENCOUNTER — Ambulatory Visit: Payer: BC Managed Care – PPO | Admitting: Family

## 2019-12-22 ENCOUNTER — Encounter: Payer: Self-pay | Admitting: Family

## 2019-12-22 VITALS — BP 136/88 | HR 105 | Temp 98.3°F | Ht 66.0 in | Wt 160.0 lb

## 2019-12-22 DIAGNOSIS — F411 Generalized anxiety disorder: Secondary | ICD-10-CM

## 2019-12-22 DIAGNOSIS — F988 Other specified behavioral and emotional disorders with onset usually occurring in childhood and adolescence: Secondary | ICD-10-CM | POA: Diagnosis not present

## 2019-12-22 DIAGNOSIS — R030 Elevated blood-pressure reading, without diagnosis of hypertension: Secondary | ICD-10-CM

## 2019-12-22 MED ORDER — AMPHETAMINE-DEXTROAMPHETAMINE 15 MG PO TABS
15.0000 mg | ORAL_TABLET | Freq: Every day | ORAL | 0 refills | Status: DC
Start: 2019-12-22 — End: 2020-02-01

## 2019-12-22 NOTE — Progress Notes (Signed)
Dana Cervantes is a 39 y.o. female with the following history as recorded in EpicCare:  Patient Active Problem List   Diagnosis Date Noted  . Hair loss 12/21/2015  . Easy bruising 12/21/2015  . Dysuria 05/12/2015  . Goiter 01/27/2014  . Preventative health care 07/16/2010  . Eczema of both hands 06/02/2009  . Allergic rhinitis 12/08/2008  . Hyperlipidemia 06/03/2008  . ANEMIA-IRON DEFICIENCY 06/03/2008  . Anxiety state 11/09/2006  . Depression 11/09/2006  . Attention deficit disorder 11/09/2006  . MIGRAINE HEADACHE 11/09/2006    Current Outpatient Medications  Medication Sig Dispense Refill  . ALPRAZolam (XANAX) 1 MG tablet TAKE 1/2 TO 1 (ONE-HALF TO ONE) TABLET BY MOUTH ONCE DAILY AS NEEDED 90 tablet 2  . citalopram (CELEXA) 20 MG tablet Take 1 tablet (20 mg total) by mouth daily. 90 tablet 3  . levonorgestrel (MIRENA, 52 MG,) 20 MCG/24HR IUD Mirena 20 mcg/24 hours (6 yrs) 52 mg intrauterine device  Take 1 device by intrauterine route.    . triamcinolone (NASACORT) 55 MCG/ACT AERO nasal inhaler Place 2 sprays into the nose daily. 3 Inhaler 3  . triamcinolone cream (KENALOG) 0.1 % Apply 1 application topically 2 (two) times daily. 30 g 1  . amphetamine-dextroamphetamine (ADDERALL) 15 MG tablet Take 1 tablet by mouth daily. 30 tablet 0  . levonorgestrel (MIRENA, 52 MG,) 20 MCG/24HR IUD Mirena 20 mcg/24 hours (7 yrs) 52 mg intrauterine device  Take 1 device by intrauterine route.     No current facility-administered medications for this visit.    Allergies: Patient has no known allergies.  Past Medical History:  Diagnosis Date  . ADD 11/09/2006  . ALLERGIC RHINITIS 12/08/2008  . ANEMIA-IRON DEFICIENCY 06/03/2008  . ANXIETY 11/09/2006  . DEPRESSION 11/09/2006  . ECZEMA 06/02/2009  . HYPERLIPIDEMIA 06/03/2008  . MIGRAINE HEADACHE 11/09/2006    Past Surgical History:  Procedure Laterality Date  . s/p IUD    . TONSILLECTOMY      Family History  Problem Relation Age of Onset  .  Hyperlipidemia Father   . Cancer Father        multiple skin cancers    Social History   Tobacco Use  . Smoking status: Never Smoker  . Smokeless tobacco: Never Used  Substance Use Topics  . Alcohol use: No    Subjective:  Patient notes that she had an episode of elevated blood pressure last week- admits it occurred on a day when she was having "stomach issues." Was seen at [facility] the next day and pressure had normalized;  Brings her home cuff for comparison today and her machine is reading about 15 points higher on systolic and diastolic numbers compared to cuff in office; Does have underlying anxiety/ takes Adderall daily; patient notes that her pulse always goes up in the office due to anxiety about appointments.    Objective:  Vitals:   12/22/19 1130  BP: 136/88  Pulse: (!) 105  Temp: 98.3 F (36.8 C)  TempSrc: Oral  SpO2: 97%  Weight: 160 lb (72.6 kg)  Height: 5\' 6"  (1.676 m)    General: Well developed, well nourished, in no acute distress  Head: Normocephalic and atraumatic  Lungs: Respirations unlabored; clear to auscultation bilaterally without wheeze, rales, rhonchi  CVS exam: normal rate and regular rhythm.  Musculoskeletal: No deformities; no active joint inflammation  Extremities: No edema, cyanosis, clubbing  Vessels: Symmetric bilaterally  Neurologic: Alert and oriented; speech intact; face symmetrical; moves all extremities well; CNII-XII intact without  focal deficit   Assessment:  1. Anxiety state   2. Attention deficit disorder, unspecified hyperactivity presence   3. Elevated blood pressure reading     Plan:  Reassurance- do not think she has hypertension; ? If symptoms are related to Adderall; will try lowering dosage but may need to consider changing to different medication like Vyvanse; trial of Adderall 15 mg daily- see her PCP for follow-up in 3 weeks, sooner prn.  This visit occurred during the SARS-CoV-2 public health emergency.  Safety  protocols were in place, including screening questions prior to the visit, additional usage of staff PPE, and extensive cleaning of exam room while observing appropriate contact time as indicated for disinfecting solutions.      Return in about 3 weeks (around 01/12/2020) for Dr. Jonny Ruiz.  No orders of the defined types were placed in this encounter.   Requested Prescriptions   Signed Prescriptions Disp Refills  . amphetamine-dextroamphetamine (ADDERALL) 15 MG tablet 30 tablet 0    Sig: Take 1 tablet by mouth daily.

## 2020-02-01 ENCOUNTER — Other Ambulatory Visit: Payer: Self-pay | Admitting: Family

## 2020-02-01 MED ORDER — AMPHETAMINE-DEXTROAMPHETAMINE 15 MG PO TABS: 15.0000 mg | ORAL_TABLET | Freq: Every day | ORAL | 0 refills | Status: DC

## 2020-02-26 ENCOUNTER — Other Ambulatory Visit: Payer: Self-pay | Admitting: Internal Medicine

## 2020-02-26 MED ORDER — AMPHETAMINE-DEXTROAMPHETAMINE 15 MG PO TABS: 15.0000 mg | ORAL_TABLET | Freq: Every day | ORAL | 0 refills | Status: DC

## 2020-03-27 ENCOUNTER — Other Ambulatory Visit: Payer: Self-pay | Admitting: Internal Medicine

## 2020-03-28 ENCOUNTER — Other Ambulatory Visit: Payer: Self-pay | Admitting: Internal Medicine

## 2020-03-28 MED ORDER — AMPHETAMINE-DEXTROAMPHETAMINE 15 MG PO TABS: 15.0000 mg | ORAL_TABLET | Freq: Every day | ORAL | 0 refills | Status: DC

## 2020-04-24 ENCOUNTER — Other Ambulatory Visit: Payer: Self-pay | Admitting: Internal Medicine

## 2020-04-25 MED ORDER — AMPHETAMINE-DEXTROAMPHETAMINE 15 MG PO TABS: 15.0000 mg | ORAL_TABLET | Freq: Every day | ORAL | 0 refills | Status: DC

## 2020-05-17 ENCOUNTER — Encounter: Payer: Self-pay | Admitting: Internal Medicine

## 2020-05-18 MED ORDER — ALPRAZOLAM 1 MG PO TABS: ORAL_TABLET | ORAL | 2 refills | Status: DC

## 2020-05-24 ENCOUNTER — Other Ambulatory Visit: Payer: Self-pay | Admitting: Internal Medicine

## 2020-05-24 NOTE — Telephone Encounter (Signed)
Dr. Jonny Ruiz is out of the office this week. Check [place] registry last filled 04/27/2020. Pls advise on refill.Marland KitchenRaechel Chute

## 2020-05-27 MED ORDER — AMPHETAMINE-DEXTROAMPHETAMINE 15 MG PO TABS: 15.0000 mg | ORAL_TABLET | Freq: Every day | ORAL | 0 refills | Status: DC

## 2020-05-30 ENCOUNTER — Other Ambulatory Visit: Payer: Self-pay

## 2020-05-31 MED ORDER — AMPHETAMINE-DEXTROAMPHETAMINE 15 MG PO TABS: 15.0000 mg | ORAL_TABLET | Freq: Every day | ORAL | 0 refills | Status: DC

## 2020-05-31 NOTE — Telephone Encounter (Signed)
Done erx today

## 2020-05-31 NOTE — Addendum Note (Signed)
Addended by: Corwin Levins on: 05/31/2020 12:31 PM   Modules accepted: Orders

## 2020-07-02 ENCOUNTER — Other Ambulatory Visit: Payer: Self-pay | Admitting: Internal Medicine

## 2020-07-04 MED ORDER — AMPHETAMINE-DEXTROAMPHETAMINE 15 MG PO TABS: 15.0000 mg | ORAL_TABLET | Freq: Every day | ORAL | 0 refills | Status: DC

## 2020-07-29 ENCOUNTER — Other Ambulatory Visit: Payer: Self-pay | Admitting: Internal Medicine

## 2020-07-29 MED ORDER — AMPHETAMINE-DEXTROAMPHETAMINE 15 MG PO TABS: 15.0000 mg | ORAL_TABLET | Freq: Every day | ORAL | 0 refills | Status: DC

## 2020-08-26 ENCOUNTER — Other Ambulatory Visit: Payer: Self-pay | Admitting: Internal Medicine

## 2020-08-26 MED ORDER — AMPHETAMINE-DEXTROAMPHETAMINE 15 MG PO TABS: 15.0000 mg | ORAL_TABLET | Freq: Every day | ORAL | 0 refills | Status: DC

## 2020-09-26 ENCOUNTER — Other Ambulatory Visit: Payer: Self-pay | Admitting: Internal Medicine

## 2020-09-27 ENCOUNTER — Other Ambulatory Visit: Payer: Self-pay | Admitting: Internal Medicine

## 2020-09-27 MED ORDER — AMPHETAMINE-DEXTROAMPHETAMINE 15 MG PO TABS: 15.0000 mg | ORAL_TABLET | Freq: Every day | ORAL | 0 refills | Status: DC

## 2020-10-31 ENCOUNTER — Encounter: Payer: Self-pay | Admitting: Internal Medicine

## 2020-11-01 ENCOUNTER — Other Ambulatory Visit: Payer: Self-pay | Admitting: Internal Medicine

## 2020-11-01 DIAGNOSIS — F988 Other specified behavioral and emotional disorders with onset usually occurring in childhood and adolescence: Secondary | ICD-10-CM

## 2020-11-01 MED ORDER — AMPHETAMINE-DEXTROAMPHETAMINE 15 MG PO TABS: 15.0000 mg | ORAL_TABLET | Freq: Every day | ORAL | 0 refills | Status: DC

## 2020-11-30 ENCOUNTER — Other Ambulatory Visit: Payer: Self-pay | Admitting: Internal Medicine

## 2020-11-30 DIAGNOSIS — F988 Other specified behavioral and emotional disorders with onset usually occurring in childhood and adolescence: Secondary | ICD-10-CM

## 2020-12-01 MED ORDER — AMPHETAMINE-DEXTROAMPHETAMINE 15 MG PO TABS: 15.0000 mg | ORAL_TABLET | Freq: Every day | ORAL | 0 refills | Status: DC

## 2020-12-01 NOTE — Telephone Encounter (Signed)
Ok to contact pt - this is last refill - needs ROV for further refills

## 2020-12-26 ENCOUNTER — Other Ambulatory Visit: Payer: Self-pay | Admitting: Internal Medicine

## 2020-12-26 DIAGNOSIS — F988 Other specified behavioral and emotional disorders with onset usually occurring in childhood and adolescence: Secondary | ICD-10-CM

## 2020-12-26 MED ORDER — AMPHETAMINE-DEXTROAMPHETAMINE 15 MG PO TABS: 15.0000 mg | ORAL_TABLET | Freq: Every day | ORAL | 0 refills | Status: DC

## 2021-01-30 ENCOUNTER — Other Ambulatory Visit: Payer: Self-pay | Admitting: Internal Medicine

## 2021-01-30 DIAGNOSIS — F988 Other specified behavioral and emotional disorders with onset usually occurring in childhood and adolescence: Secondary | ICD-10-CM

## 2021-01-30 MED ORDER — AMPHETAMINE-DEXTROAMPHETAMINE 15 MG PO TABS: 15.0000 mg | ORAL_TABLET | Freq: Every day | ORAL | 0 refills | Status: DC

## 2021-02-01 ENCOUNTER — Encounter: Payer: Self-pay | Admitting: Internal Medicine

## 2021-02-01 MED ORDER — ALPRAZOLAM 1 MG PO TABS: ORAL_TABLET | ORAL | 0 refills | Status: DC

## 2021-03-01 ENCOUNTER — Other Ambulatory Visit: Payer: Self-pay

## 2021-03-01 ENCOUNTER — Encounter: Payer: Self-pay | Admitting: Internal Medicine

## 2021-03-01 ENCOUNTER — Ambulatory Visit (INDEPENDENT_AMBULATORY_CARE_PROVIDER_SITE_OTHER): Payer: BC Managed Care – PPO | Admitting: Internal Medicine

## 2021-03-01 VITALS — BP 122/80 | HR 103 | Resp 18 | Ht 66.0 in | Wt 178.6 lb

## 2021-03-01 DIAGNOSIS — E785 Hyperlipidemia, unspecified: Secondary | ICD-10-CM

## 2021-03-01 DIAGNOSIS — E559 Vitamin D deficiency, unspecified: Secondary | ICD-10-CM | POA: Diagnosis not present

## 2021-03-01 DIAGNOSIS — Z1159 Encounter for screening for other viral diseases: Secondary | ICD-10-CM

## 2021-03-01 DIAGNOSIS — F988 Other specified behavioral and emotional disorders with onset usually occurring in childhood and adolescence: Secondary | ICD-10-CM

## 2021-03-01 DIAGNOSIS — E538 Deficiency of other specified B group vitamins: Secondary | ICD-10-CM | POA: Diagnosis not present

## 2021-03-01 DIAGNOSIS — Z0000 Encounter for general adult medical examination without abnormal findings: Secondary | ICD-10-CM

## 2021-03-01 LAB — TSH: TSH: 1.1 u[IU]/mL (ref 0.35–5.50)

## 2021-03-01 LAB — URINALYSIS, ROUTINE W REFLEX MICROSCOPIC
Bilirubin Urine: NEGATIVE
Hgb urine dipstick: NEGATIVE
Ketones, ur: NEGATIVE
Leukocytes,Ua: NEGATIVE
Nitrite: NEGATIVE
RBC / HPF: NONE SEEN (ref 0–?)
Specific Gravity, Urine: 1.01 (ref 1.000–1.030)
Total Protein, Urine: NEGATIVE
Urine Glucose: NEGATIVE
Urobilinogen, UA: 0.2 (ref 0.0–1.0)
WBC, UA: NONE SEEN (ref 0–?)
pH: 6 (ref 5.0–8.0)

## 2021-03-01 LAB — BASIC METABOLIC PANEL
BUN: 14 mg/dL (ref 6–23)
CO2: 28 mEq/L (ref 19–32)
Calcium: 10.1 mg/dL (ref 8.4–10.5)
Chloride: 98 mEq/L (ref 96–112)
Creatinine, Ser: 0.56 mg/dL (ref 0.40–1.20)
GFR: 115.95 mL/min (ref >60.00)
Glucose, Bld: 95 mg/dL (ref 70–99)
Potassium: 4 mEq/L (ref 3.5–5.1)
Sodium: 136 mEq/L (ref 135–145)

## 2021-03-01 LAB — CBC WITH DIFFERENTIAL/PLATELET
Basophils Absolute: 0 10*3/uL (ref 0.0–0.1)
Basophils Relative: 0.5 % (ref 0.0–3.0)
Eosinophils Absolute: 0.1 10*3/uL (ref 0.0–0.7)
Eosinophils Relative: 1.9 % (ref 0.0–5.0)
HCT: 38.6 % (ref 36.0–46.0)
Hemoglobin: 13.4 g/dL (ref 12.0–15.0)
Lymphocytes Relative: 24.1 % (ref 12.0–46.0)
Lymphs Abs: 1.5 10*3/uL (ref 0.7–4.0)
MCHC: 34.8 g/dL (ref 30.0–36.0)
MCV: 100.5 fl — ABNORMAL HIGH (ref 78.0–100.0)
Monocytes Absolute: 0.6 10*3/uL (ref 0.1–1.0)
Monocytes Relative: 9.9 % (ref 3.0–12.0)
Neutro Abs: 3.9 10*3/uL (ref 1.4–7.7)
Neutrophils Relative %: 63.6 % (ref 43.0–77.0)
Platelets: 219 10*3/uL (ref 150.0–400.0)
RBC: 3.84 Mil/uL — ABNORMAL LOW (ref 3.87–5.11)
RDW: 13 % (ref 11.5–15.5)
WBC: 6.2 10*3/uL (ref 4.0–10.5)

## 2021-03-01 LAB — HEPATIC FUNCTION PANEL
ALT: 61 U/L — ABNORMAL HIGH (ref 0–35)
AST: 42 U/L — ABNORMAL HIGH (ref 0–37)
Albumin: 4.8 g/dL (ref 3.5–5.2)
Alkaline Phosphatase: 47 U/L (ref 39–117)
Bilirubin, Direct: 0.2 mg/dL (ref 0.0–0.3)
Total Bilirubin: 0.9 mg/dL (ref 0.2–1.2)
Total Protein: 7.6 g/dL (ref 6.0–8.3)

## 2021-03-01 LAB — LIPID PANEL
Cholesterol: 226 mg/dL — ABNORMAL HIGH (ref 0–200)
HDL: 76.4 mg/dL (ref >39.00)
LDL Cholesterol: 110 mg/dL — ABNORMAL HIGH (ref 0–99)
NonHDL: 149.81
Total CHOL/HDL Ratio: 3
Triglycerides: 199 mg/dL — ABNORMAL HIGH (ref 0.0–149.0)
VLDL: 39.8 mg/dL (ref 0.0–40.0)

## 2021-03-01 LAB — VITAMIN D 25 HYDROXY (VIT D DEFICIENCY, FRACTURES): VITD: 53 ng/mL (ref 30.00–100.00)

## 2021-03-01 LAB — VITAMIN B12: Vitamin B-12: 509 pg/mL (ref 211–911)

## 2021-03-01 MED ORDER — AMPHETAMINE-DEXTROAMPHETAMINE 15 MG PO TABS: 15.0000 mg | ORAL_TABLET | Freq: Every day | ORAL | 0 refills | Status: DC

## 2021-03-01 MED ORDER — CITALOPRAM HYDROBROMIDE 20 MG PO TABS: 20.0000 mg | ORAL_TABLET | Freq: Every day | ORAL | 3 refills | Status: DC

## 2021-03-01 MED ORDER — ALPRAZOLAM 1 MG PO TABS: ORAL_TABLET | ORAL | 1 refills | Status: DC

## 2021-03-01 NOTE — Progress Notes (Signed)
Patient ID: Dana Cervantes, female   DOB: 11-21-1980, 40 y.o.   MRN: [id number]         Chief Complaint:: wellness exam       HPI:  Dana Cervantes is a 40 y.o. female here for wellness exam; for pap scheduled next week, due for hep c sreen, declines flu shot, o/w up to date Has been more active with some increased [facility] activity [facility]. Pt denies chest pain, increased sob or doe, wheezing, orthopnea, PND, increased LE swelling, palpitations, dizziness or syncope.   Pt denies polydipsia, polyuria, or new focal neuro s/s.   Pt denies fever, wt loss, night sweats, loss of appetite, or other constitutional symptoms     Wt Readings from Last 3 Encounters:  03/01/21 178 lb 9.6 oz (81 kg)  12/22/19 160 lb (72.6 kg)  09/16/19 158 lb (71.7 kg)   BP Readings from Last 3 Encounters:  03/01/21 122/80  12/22/19 136/88  09/16/19 100/80   Immunization History  Administered Date(s) Administered   Influenza,inj,Quad PF,6+ Mos 01/20/2013, 01/27/2014, 12/21/2015, 12/25/2016, 12/30/2017, 01/08/2019   Td 06/03/2008   Tdap 01/08/2019   Health Maintenance Due  Topic Date Due   Hepatitis C Screening  Never done      Past Medical History:  Diagnosis Date   ADD 11/09/2006   ALLERGIC RHINITIS 12/08/2008   ANEMIA-IRON DEFICIENCY 06/03/2008   ANXIETY 11/09/2006   DEPRESSION 11/09/2006   ECZEMA 06/02/2009   HYPERLIPIDEMIA 06/03/2008   MIGRAINE HEADACHE 11/09/2006   Past Surgical History:  Procedure Laterality Date   s/p IUD     TONSILLECTOMY      reports that she has never smoked. She has never used smokeless tobacco. She reports that she does not drink alcohol and does not use drugs. family history includes Cancer in her father; Hyperlipidemia in her father. No Known Allergies Current Outpatient Medications on File Prior to Visit  Medication Sig Dispense Refill   levonorgestrel (MIRENA, 52 MG,) 20 MCG/24HR IUD Mirena 20 mcg/24 hours (6 yrs) 52 mg intrauterine device  Take 1 device by  intrauterine route.     levonorgestrel (MIRENA, 52 MG,) 20 MCG/24HR IUD Mirena 20 mcg/24 hours (7 yrs) 52 mg intrauterine device  Take 1 device by intrauterine route.     triamcinolone (NASACORT) 55 MCG/ACT AERO nasal inhaler Place 2 sprays into the nose daily. 3 Inhaler 3   triamcinolone cream (KENALOG) 0.1 % Apply 1 application topically 2 (two) times daily. 30 g 1   No current facility-administered medications on file prior to visit.        ROS:  All others reviewed and negative.  Objective        PE:  BP 122/80    Pulse (!) 103    Resp 18    Ht 5\' 6"  (1.676 m)    Wt 178 lb 9.6 oz (81 kg)    SpO2 99%    BMI 28.83 kg/m                 Constitutional: Pt appears in NAD               HENT: Head: NCAT.                Right Ear: External ear normal.                 Left Ear: External ear normal.  Eyes: . Pupils are equal, round, and reactive to light. Conjunctivae and EOM are normal               Nose: without d/c or deformity               Neck: Neck supple. Gross normal ROM               Cardiovascular: Normal rate and regular rhythm.                 Pulmonary/Chest: Effort normal and breath sounds without rales or wheezing.                Abd:  Soft, NT, ND, + BS, no organomegaly               Neurological: Pt is alert. At baseline orientation, motor grossly intact               Skin: Skin is warm. No rashes, no other new lesions, LE edema - none               Psychiatric: Pt behavior is normal without agitation   Micro: none  Cardiac tracings I have personally interpreted today:  none  Pertinent Radiological findings (summarize): none   Lab Results  Component Value Date   WBC 6.2 03/01/2021   HGB 13.4 03/01/2021   HCT 38.6 03/01/2021   PLT 219.0 03/01/2021   GLUCOSE 95 03/01/2021   CHOL 226 (H) 03/01/2021   TRIG 199.0 (H) 03/01/2021   HDL 76.40 03/01/2021   LDLCALC 110 (H) 03/01/2021   ALT 61 (H) 03/01/2021   AST 42 (H) 03/01/2021   NA 136 03/01/2021    K 4.0 03/01/2021   CL 98 03/01/2021   CREATININE 0.56 03/01/2021   BUN 14 03/01/2021   CO2 28 03/01/2021   TSH 1.10 03/01/2021   Assessment/Plan:  Dana Cervantes is a 40 y.o. White or Caucasian [1] female with  has a past medical history of ADD (11/09/2006), ALLERGIC RHINITIS (12/08/2008), ANEMIA-IRON DEFICIENCY (06/03/2008), ANXIETY (11/09/2006), DEPRESSION (11/09/2006), ECZEMA (06/02/2009), HYPERLIPIDEMIA (06/03/2008), and MIGRAINE HEADACHE (11/09/2006).  Preventative health care Age and sex appropriate education and counseling updated with regular exercise and diet Referrals for preventative services - for hep c screen, pap next week Immunizations addressed - declines flu shot Smoking counseling  - none needed Evidence for depression or other mood disorder - none significant Most recent labs reviewed. I have personally reviewed and have noted: 1) the patient's medical and social history 2) The patient's current medications and supplements 3) The patient's height, weight, and BMI have been recorded in the chart   Attention deficit disorder Stable, for cont'd adderal  Followup: Return in about 1 year (around 03/01/2022).  Oliver Barre, MD 03/01/2021 7:52 PM [facility]

## 2021-03-01 NOTE — Assessment & Plan Note (Signed)
Age and sex appropriate education and counseling updated with regular exercise and diet Referrals for preventative services - for hep c screen, pap next week Immunizations addressed - declines flu shot Smoking counseling  - none needed Evidence for depression or other mood disorder - none significant Most recent labs reviewed. I have personally reviewed and have noted: 1) the patient's medical and social history 2) The patient's current medications and supplements 3) The patient's height, weight, and BMI have been recorded in the chart

## 2021-03-01 NOTE — Assessment & Plan Note (Signed)
Stable, for cont'd adderal

## 2021-03-01 NOTE — Patient Instructions (Signed)

## 2021-03-02 LAB — HEPATITIS C ANTIBODY
Hepatitis C Ab: NONREACTIVE
SIGNAL TO CUT-OFF: <0.02 (ref <1.00)

## 2021-03-06 DIAGNOSIS — Z1151 Encounter for screening for human papillomavirus (HPV): Secondary | ICD-10-CM | POA: Diagnosis not present

## 2021-03-06 DIAGNOSIS — Z01419 Encounter for gynecological examination (general) (routine) without abnormal findings: Secondary | ICD-10-CM | POA: Diagnosis not present

## 2021-03-06 DIAGNOSIS — Z6830 Body mass index (BMI) 30.0-30.9, adult: Secondary | ICD-10-CM | POA: Diagnosis not present

## 2021-03-06 DIAGNOSIS — Z124 Encounter for screening for malignant neoplasm of cervix: Secondary | ICD-10-CM | POA: Diagnosis not present

## 2021-04-05 ENCOUNTER — Other Ambulatory Visit: Payer: Self-pay | Admitting: Internal Medicine

## 2021-04-05 DIAGNOSIS — F988 Other specified behavioral and emotional disorders with onset usually occurring in childhood and adolescence: Secondary | ICD-10-CM

## 2021-04-06 MED ORDER — AMPHETAMINE-DEXTROAMPHETAMINE 15 MG PO TABS: 15.0000 mg | ORAL_TABLET | Freq: Every day | ORAL | 0 refills | Status: DC

## 2021-05-04 ENCOUNTER — Encounter: Payer: Self-pay | Admitting: Internal Medicine

## 2021-05-09 ENCOUNTER — Other Ambulatory Visit: Payer: Self-pay | Admitting: Internal Medicine

## 2021-05-09 DIAGNOSIS — F988 Other specified behavioral and emotional disorders with onset usually occurring in childhood and adolescence: Secondary | ICD-10-CM

## 2021-05-09 MED ORDER — AMPHETAMINE-DEXTROAMPHETAMINE 15 MG PO TABS: 15.0000 mg | ORAL_TABLET | Freq: Every day | ORAL | 0 refills | Status: DC

## 2021-06-06 ENCOUNTER — Other Ambulatory Visit: Payer: Self-pay | Admitting: Internal Medicine

## 2021-06-06 DIAGNOSIS — F988 Other specified behavioral and emotional disorders with onset usually occurring in childhood and adolescence: Secondary | ICD-10-CM

## 2021-06-06 MED ORDER — AMPHETAMINE-DEXTROAMPHETAMINE 15 MG PO TABS: 15.0000 mg | ORAL_TABLET | Freq: Every day | ORAL | 0 refills | Status: DC

## 2021-06-30 ENCOUNTER — Ambulatory Visit (INDEPENDENT_AMBULATORY_CARE_PROVIDER_SITE_OTHER): Payer: BC Managed Care – PPO | Admitting: Internal Medicine

## 2021-06-30 ENCOUNTER — Encounter: Payer: Self-pay | Admitting: Internal Medicine

## 2021-06-30 VITALS — BP 130/78 | HR 120 | Temp 98.8°F | Ht 66.0 in | Wt 176.0 lb

## 2021-06-30 DIAGNOSIS — F411 Generalized anxiety disorder: Secondary | ICD-10-CM | POA: Diagnosis not present

## 2021-06-30 DIAGNOSIS — H1013 Acute atopic conjunctivitis, bilateral: Secondary | ICD-10-CM | POA: Diagnosis not present

## 2021-06-30 DIAGNOSIS — J309 Allergic rhinitis, unspecified: Secondary | ICD-10-CM

## 2021-06-30 DIAGNOSIS — L309 Dermatitis, unspecified: Secondary | ICD-10-CM | POA: Diagnosis not present

## 2021-06-30 MED ORDER — PREDNISONE 10 MG PO TABS: ORAL_TABLET | ORAL | 0 refills | Status: DC

## 2021-06-30 MED ORDER — AZELASTINE HCL 0.05 % OP SOLN: 1.0000 [drp] | Freq: Two times a day (BID) | OPHTHALMIC | 12 refills | Status: AC

## 2021-06-30 MED ORDER — METHYLPREDNISOLONE ACETATE 40 MG/ML IJ SUSP
80.0000 mg | Freq: Once | INTRAMUSCULAR | Status: AC
  Administered 2021-06-30: 80 mg via INTRAMUSCULAR

## 2021-06-30 NOTE — Patient Instructions (Signed)
You had the steroid shot today ? ?Please take all new medication as prescribed - the prednisone, and also the eye drops ? ?Ok to continue OTC antihistamine and nasal steroid as well (such as nasacort) ? ?Please continue all other medications as before, and refills have been done if requested. ? ?Please have the pharmacy call with any other refills you may need. ? ?Please keep your appointments with your specialists as you may have planned ? ? ?

## 2021-06-30 NOTE — Progress Notes (Signed)
Patient ID: Dana Cervantes, female   DOB: [birth year], 41 y.o.   MRN: [id number] ? ? ? ?    Chief Complaint: follow up allergy symptom flare, eczema, anxiety ? ?     HPI:  Dana Cervantes is a 41 y.o. female who Does have several wks ongoing nasal allergy symptoms with clearish congestion, itch and sneezing, as well as eye itching redness and watery d/c,,  without fever, pain, ST, cough, swelling or wheezing.  Pt denies chest pain, increased sob or doe, wheezing, orthopnea, PND, increased LE swelling, palpitations, dizziness or syncope.   Pt denies polydipsia, polyuria, or new focal neuro s/s.    Pt denies fever, wt loss, night sweats, loss of appetite, or other constitutional symptoms  Denies worsening depressive symptoms, suicidal ideation, or panic.  Rash to hands with mild flare as well in the past 2 wks, with itch, but no worsening pain, fever, red streaks or d/c.   ?      ?Wt Readings from Last 3 Encounters:  ?06/30/21 176 lb (79.8 kg)  ?03/01/21 178 lb 9.6 oz (81 kg)  ?12/22/19 160 lb (72.6 kg)  ? ?BP Readings from Last 3 Encounters:  ?06/30/21 130/78  ?03/01/21 122/80  ?12/22/19 136/88  ? ?      ?Past Medical History:  ?Diagnosis Date  ? ADD 11/09/2006  ? ALLERGIC RHINITIS 12/08/2008  ? ANEMIA-IRON DEFICIENCY 06/03/2008  ? ANXIETY 11/09/2006  ? DEPRESSION 11/09/2006  ? ECZEMA 06/02/2009  ? HYPERLIPIDEMIA 06/03/2008  ? MIGRAINE HEADACHE 11/09/2006  ? ?Past Surgical History:  ?Procedure Laterality Date  ? s/p IUD    ? TONSILLECTOMY    ? ? reports that she has never smoked. She has never used smokeless tobacco. She reports that she does not drink alcohol and does not use drugs. ?family history includes Cancer in her father; Hyperlipidemia in her father. ?No Known Allergies ?Current Outpatient Medications on File Prior to Visit  ?Medication Sig Dispense Refill  ? ALPRAZolam (XANAX) 1 MG tablet TAKE 1/2 TO 1 (ONE-HALF TO ONE) TABLET BY MOUTH ONCE DAILY AS NEEDED 90 tablet 1  ? amphetamine-dextroamphetamine (ADDERALL) 15 MG  tablet Take 1 tablet by mouth daily. 30 tablet 0  ? levonorgestrel (MIRENA, 52 MG,) 20 MCG/24HR IUD Mirena 20 mcg/24 hours (6 yrs) 52 mg intrauterine device ? Take 1 device by intrauterine route.    ? levonorgestrel (MIRENA, 52 MG,) 20 MCG/24HR IUD Mirena 20 mcg/24 hours (7 yrs) 52 mg intrauterine device ? Take 1 device by intrauterine route.    ? triamcinolone (NASACORT) 55 MCG/ACT AERO nasal inhaler Place 2 sprays into the nose daily. 3 Inhaler 3  ? triamcinolone cream (KENALOG) 0.1 % Apply 1 application topically 2 (two) times daily. 30 g 1  ? citalopram (CELEXA) 20 MG tablet Take 1 tablet (20 mg total) by mouth daily. (Patient not taking: Reported on 06/30/2021) 90 tablet 3  ? ?No current facility-administered medications on file prior to visit.  ? ?     ROS:  All others reviewed and negative. ? ?Objective  ? ?     PE:  BP 130/78 (BP Location: Right Arm, Patient Position: Sitting, Cuff Size: Normal)   Pulse (!) 120   Temp 98.8 ?F (37.1 ?C) (Oral)   Ht 5\' 6"  (1.676 m)   Wt 176 lb (79.8 kg)   SpO2 97%   BMI 28.41 kg/m?  ? ?              Constitutional: Pt appears in  NAD, non toxic ?              HENT: Head: NCAT.  ?              Right Ear: External ear normal.   ?              Left Ear: External ear normal. Bilat tm's with mild erythema.  Max sinus areas mild tender.  Pharynx with mild erythema, no exudate  ?              Eyes: . Pupils are equal, round, and reactive to light. Conjunctivae with bialteral watery d/c and redness, and EOM are normal ?              Nose: without d/c or deformity ?              Neck: Neck supple. Gross normal ROM ?              Cardiovascular: Normal rate and regular rhythm.   ?              Pulmonary/Chest: Effort normal and breath sounds without rales or wheezing.  ?              Abd:  Soft, NT, ND, + BS, no organomegaly ?              Neurological: Pt is alert. At baseline orientation, motor grossly intact ?              Skin: Skin is warm. Itchy hand erythema rashes, no other  new lesions, LE edema - none ?              Psychiatric: Pt behavior is normal without agitation  ? ?Micro: none ? ?Cardiac tracings I have personally interpreted today:  none ? ?Pertinent Radiological findings (summarize): none  ? ?Lab Results  ?Component Value Date  ? WBC 6.2 03/01/2021  ? HGB 13.4 03/01/2021  ? HCT 38.6 03/01/2021  ? PLT 219.0 03/01/2021  ? GLUCOSE 95 03/01/2021  ? CHOL 226 (H) 03/01/2021  ? TRIG 199.0 (H) 03/01/2021  ? HDL 76.40 03/01/2021  ? LDLCALC 110 (H) 03/01/2021  ? ALT 61 (H) 03/01/2021  ? AST 42 (H) 03/01/2021  ? NA 136 03/01/2021  ? K 4.0 03/01/2021  ? CL 98 03/01/2021  ? CREATININE 0.56 03/01/2021  ? BUN 14 03/01/2021  ? CO2 28 03/01/2021  ? TSH 1.10 03/01/2021  ? ?Assessment/Plan:  ?Dana Cervantes is a 41 y.o. White or Caucasian [1] female with  has a past medical history of ADD (11/09/2006), ALLERGIC RHINITIS (12/08/2008), ANEMIA-IRON DEFICIENCY (06/03/2008), ANXIETY (11/09/2006), DEPRESSION (11/09/2006), ECZEMA (06/02/2009), HYPERLIPIDEMIA (06/03/2008), and MIGRAINE HEADACHE (11/09/2006). ? ?Allergic conjunctivitis and rhinitis, bilateral ?With recent seasonal flare, for depomedrol im 80, predpac asd, optivar asd, and continue otc antihistamine and nasacort ? ?Anxiety state ?Stable, cont benzo prn,  to f/u any worsening symptoms or concerns ? ?Eczema of both hands ?Mld flare, ok for restart traim cr prn,  to f/u any worsening symptoms or concerns ? ?Followup: Return if symptoms worsen or fail to improve. ? ?Oliver Barre, MD 07/01/2021 7:52 PM ? Medical Group ?[facility] ?Internal Medicine ?

## 2021-07-01 ENCOUNTER — Encounter: Payer: Self-pay | Admitting: Internal Medicine

## 2021-07-01 NOTE — Assessment & Plan Note (Signed)
Stable, cont benzo prn,  to f/u any worsening symptoms or concerns ?

## 2021-07-01 NOTE — Assessment & Plan Note (Signed)
With recent seasonal flare, for depomedrol im 80, predpac asd, optivar asd, and continue otc antihistamine and nasacort ?

## 2021-07-01 NOTE — Assessment & Plan Note (Signed)
Mld flare, ok for restart traim cr prn,  to f/u any worsening symptoms or concerns ?

## 2021-07-03 ENCOUNTER — Ambulatory Visit: Payer: BC Managed Care – PPO | Admitting: Internal Medicine

## 2021-07-05 ENCOUNTER — Encounter: Payer: Self-pay | Admitting: Internal Medicine

## 2021-07-05 DIAGNOSIS — H1013 Acute atopic conjunctivitis, bilateral: Secondary | ICD-10-CM

## 2021-07-07 ENCOUNTER — Other Ambulatory Visit: Payer: Self-pay | Admitting: Internal Medicine

## 2021-07-07 DIAGNOSIS — F988 Other specified behavioral and emotional disorders with onset usually occurring in childhood and adolescence: Secondary | ICD-10-CM

## 2021-07-07 MED ORDER — AMPHETAMINE-DEXTROAMPHETAMINE 15 MG PO TABS: 15.0000 mg | ORAL_TABLET | Freq: Every day | ORAL | 0 refills | Status: DC

## 2021-07-07 NOTE — Telephone Encounter (Signed)
LOV 06/30/21

## 2021-07-25 ENCOUNTER — Ambulatory Visit (INDEPENDENT_AMBULATORY_CARE_PROVIDER_SITE_OTHER): Payer: BC Managed Care – PPO | Admitting: Allergy & Immunology

## 2021-07-25 ENCOUNTER — Encounter: Payer: Self-pay | Admitting: Allergy & Immunology

## 2021-07-25 VITALS — BP 158/90 | HR 141 | Temp 98.3°F | Resp 24 | Ht 65.35 in | Wt 176.6 lb

## 2021-07-25 DIAGNOSIS — R21 Rash and other nonspecific skin eruption: Secondary | ICD-10-CM | POA: Diagnosis not present

## 2021-07-25 DIAGNOSIS — J302 Other seasonal allergic rhinitis: Secondary | ICD-10-CM

## 2021-07-25 DIAGNOSIS — L2089 Other atopic dermatitis: Secondary | ICD-10-CM | POA: Diagnosis not present

## 2021-07-25 NOTE — Progress Notes (Signed)
? ?NEW PATIENT ? ?Date of Service/Encounter:  07/25/21 ? ?Consult requested by: Corwin Levins, MD ? ? ?Assessment:  ? ?Seasonal and perennial allergic rhinitis ? ?Rash - in conjunction with acute onset allergic rhinitis symptoms ? ?Eczema - controled with PRN topical steroid ? ? ? ?Plan/Recommendations:  ? ?1. Seasonal and perennial allergic rhinitis ?- Testing today showed: grasses, ragweed, weeds, dust mites, cat, and cockroach. ?- Copy of test results provided. ?- Avoidance measures provided. ?- Continue with: Zyrtec (cetirizine) 10mg  tablet once daily ?- Start taking: Singulair (montelukast) 10mg  daily and Ryaltris (olopatadine/mometasone) two sprays per nostril 1-2 times daily as needed ?- Montelukast can cause irritability and depression, so beware of that!  ?- You can use an extra dose of the antihistamine, if needed, for breakthrough symptoms.  ?- Consider nasal saline rinses 1-2 times daily to remove allergens from the nasal cavities as well as help with mucous clearance (this is especially helpful to do before the nasal sprays are given) ?- Consider allergy shots as a means of long-term control. ?- Allergy shots "re-train" and "reset" the immune system to ignore environmental allergens and decrease the resulting immune response to those allergens (sneezing, itchy watery eyes, runny nose, nasal congestion, etc).    ?- Allergy shots improve symptoms in 75-85% of patients.  ?- We can discuss more at the next appointment if the medications are not working for you. ? ?2. Return in about 6 months (around 01/25/2022).  ? ? ?This note in its entirety was forwarded to the Provider who requested this consultation. ? ?Subjective:  ? ?KIRSTEN SPEARING is a 41 y.o. female presenting today for evaluation of  ?Chief Complaint  ?Patient presents with  ? Allergic Rhinitis   ?  Severe seasonal allergies started 05/03/21. March started taking OTC antihistamines. Eyes are watery all day long. Had a round of prednisone last  week. Had sinus headaches. Had no issues the last three days when she was off her antihistamines.  ? Other  ?  Patient is very nervous and had a small panic attack in the parking lot. Will recheck vitals again at end of the appointment.  ? ? ?VARETTA CHAVERS has a history of the following: ?Patient Active Problem List  ? Diagnosis Date Noted  ? Allergic conjunctivitis and rhinitis, bilateral 06/30/2021  ? Hair loss 12/21/2015  ? Easy bruising 12/21/2015  ? Dysuria 05/12/2015  ? Goiter 01/27/2014  ? Preventative health care 07/16/2010  ? Eczema of both hands 06/02/2009  ? Allergic rhinitis 12/08/2008  ? Hyperlipidemia 06/03/2008  ? ANEMIA-IRON DEFICIENCY 06/03/2008  ? Anxiety state 11/09/2006  ? Depression 11/09/2006  ? Attention deficit disorder 11/09/2006  ? MIGRAINE HEADACHE 11/09/2006  ? ? ?History obtained from: chart review and patient. ? ?11/11/2006 was referred by 11/11/2006, MD.    ? ?Bianey is a 41 y.o. female presenting for an evaluation of environmental allergies .   ?  ?Allergic Rhinitis Symptom History: She has "weird" allergy symptoms that interestingly stopped since stopping her antihistamines. Symptoms started in February. They were cleaning brush out at work and she had to go home. She went home and took a shower. The next day she had a swollen face. Since then, she has had intermittent sinus congestion. She has never had sneezing or coughing. But she did have watery eyes.  She went to the beach in April and all of the congestion went away. She was left with constant watering eyes and puffiness. She was  placed on prednisone and medicated eye drops. Now she is here and ironically she has not been on medications for the amount of time recommended by the appointment. But she has not been at work either. She knows that there is mold in the building at work. She had no changes when this first started in February. ? ?She has bene on Zyrtec regularly for years. She does not react to poison  ivy. ? ?She has been tested years ago when she was very young. She breakouts in hives once per year or so.  ? ?Food Allergy Symptom History: She was eating [place] two of the time that she was breaking out.  She has had [place] since that time without issues.  ? ?Skin Symptom History: She does have a history of eczema. She has a steroid cream that she puts on it when it flares.  It has never gotten to the point to see a Dermatologist. ? ?Otherwise, there is no history of other atopic diseases, including asthma, food allergies, drug allergies, stinging insect allergies, or contact dermatitis. There is no significant infectious history. Vaccinations are up to date.  ? ? ?Past Medical History: ?Patient Active Problem List  ? Diagnosis Date Noted  ? Allergic conjunctivitis and rhinitis, bilateral 06/30/2021  ? Hair loss 12/21/2015  ? Easy bruising 12/21/2015  ? Dysuria 05/12/2015  ? Goiter 01/27/2014  ? Preventative health care 07/16/2010  ? Eczema of both hands 06/02/2009  ? Allergic rhinitis 12/08/2008  ? Hyperlipidemia 06/03/2008  ? ANEMIA-IRON DEFICIENCY 06/03/2008  ? Anxiety state 11/09/2006  ? Depression 11/09/2006  ? Attention deficit disorder 11/09/2006  ? MIGRAINE HEADACHE 11/09/2006  ? ? ?Medication List:  ?Allergies as of 07/25/2021   ?No Known Allergies ?  ? ?  ?Medication List  ?  ? ?  ? Accurate as of Jul 25, 2021 11:59 PM. If you have any questions, ask your nurse or doctor.  ?  ?  ? ?  ? ?ALPRAZolam 1 MG tablet ?Commonly known as: Prudy Feeler ?TAKE 1/2 TO 1 (ONE-HALF TO ONE) TABLET BY MOUTH ONCE DAILY AS NEEDED ?  ?amphetamine-dextroamphetamine 15 MG tablet ?Commonly known as: Adderall ?Take 1 tablet by mouth daily. ?  ?azelastine 0.05 % ophthalmic solution ?Commonly known as: OPTIVAR ?Place 1 drop into both eyes 2 (two) times daily. ?  ?citalopram 20 MG tablet ?Commonly known as: CELEXA ?Take 1 tablet (20 mg total) by mouth daily. ?  ?fluticasone 50 MCG/ACT nasal spray ?Commonly known as: FLONASE ?Place 1-2  sprays into both nostrils daily. ?  ?ibuprofen 200 MG tablet ?Commonly known as: ADVIL ?Take 200 mg by mouth every 6 (six) hours as needed. ?  ?LORATADINE PO ?Take 10 mg by mouth daily. Equate allergy ?  ?Mirena (52 MG) 20 MCG/DAY Iud ?Generic drug: levonorgestrel ?Mirena 20 mcg/24 hours (6 yrs) 52 mg intrauterine device ? Take 1 device by intrauterine route. ?  ?predniSONE 10 MG tablet ?Commonly known as: DELTASONE ?3 tabs by mouth per day for 3 days,2tabs per day for 3 days,1tab per day for 3 days ?  ?triamcinolone 55 MCG/ACT Aero nasal inhaler ?Commonly known as: NASACORT ?Place 2 sprays into the nose daily. ?  ?triamcinolone cream 0.1 % ?Commonly known as: KENALOG ?Apply 1 application topically 2 (two) times daily. ?  ?UNABLE TO FIND ?Take 10 mg by mouth daily. Equate allergy relief 5 mg cetrizine/20 mg of decongestant ?  ?vitamin C 500 MG tablet ?Commonly known as: ASCORBIC ACID ?Take 500 mg by mouth daily. ?  ?  VITAMIN D3 PO ?Take 5,000 Int'l Units/day by mouth daily. ?  ? ?  ? ? ?Birth History: non-contributory ? ?Developmental History: non-contributory ? ?Past Surgical History: ?Past Surgical History:  ?Procedure Laterality Date  ? s/p IUD    ? TONSILLECTOMY    ? ? ? ?Family History: ?Family History  ?Problem Relation Age of Onset  ? Allergic rhinitis Father   ? Hyperlipidemia Father   ? Cancer Father   ?     multiple skin cancers  ? ? ? ?Social History: [place] lives at home with her family.  She lives in a house that is 39 years old.  There is wood and area rugs in the main living areas and carpeting in the bedroom.  She has electric heating and heat pump for cooling.  There are 2 dogs, as well as a cat inside the home.  There are no dust mite covers on the bedding.  There is no tobacco exposure.  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  She has done this for 15 years.  There is no fume, chemical, but there is dust exposure.  There is no HEPA filter.  There is no tobacco exposure. She  works at [employer] [place]. She has been there fore 15 years.  ? ? ?Review of Systems  ?Constitutional: Negative.  Negative for chills, fever, malaise/fatigue and weight loss.  ?HENT:  Positive for congestion and sinus pain. Neg

## 2021-07-25 NOTE — Patient Instructions (Addendum)
1. Seasonal and perennial allergic rhinitis ?- Testing today showed: grasses, ragweed, weeds, dust mites, cat, and cockroach. ?- Copy of test results provided. ?- Avoidance measures provided. ?- Continue with: Zyrtec (cetirizine) 10mg  tablet once daily ?- Start taking: Singulair (montelukast) 10mg  daily and Ryaltris (olopatadine/mometasone) two sprays per nostril 1-2 times daily as needed ?- Montelukast can cause irritability and depression, so beware of that!  ?- You can use an extra dose of the antihistamine, if needed, for breakthrough symptoms.  ?- Consider nasal saline rinses 1-2 times daily to remove allergens from the nasal cavities as well as help with mucous clearance (this is especially helpful to do before the nasal sprays are given) ?- Consider allergy shots as a means of long-term control. ?- Allergy shots "re-train" and "reset" the immune system to ignore environmental allergens and decrease the resulting immune response to those allergens (sneezing, itchy watery eyes, runny nose, nasal congestion, etc).    ?- Allergy shots improve symptoms in 75-85% of patients.  ?- We can discuss more at the next appointment if the medications are not working for you. ? ?2. Return in about 6 months (around 01/25/2022).  ? ? ?Please inform 06-06-1993 of any Emergency Department visits, hospitalizations, or changes in symptoms. Call 13/11/2021 before going to the ED for breathing or allergy symptoms since we might be able to fit you in for a sick visit. Feel free to contact us anytime with any questions, problems, or concerns. ? ?It was a pleasure to meet you today! ? ?Websites that have reliable patient information: ?1. American Academy of Asthma, Allergy, and Immunology: www.aaaai.org ?2. Food Allergy Research and Education (FARE): foodallergy.org ?3. Mothers of Asthmatics: http://www.asthmacommunitynetwork.org ?4. [place] of Allergy, Asthma, and Immunology: [place] ? ? ?COVID-19 Vaccine Information can be found at:  [employer] For questions related to vaccine distribution or appointments, please email [email or web address] or call [phone].  ? ?We realize that you might be concerned about having an allergic reaction to the COVID19 vaccines. To help with that concern, WE ARE OFFERING THE COVID19 VACCINES IN OUR OFFICE! Ask the front desk for dates!  ? ? ? ??Like? [email or web address] on Facebook and Instagram for our latest updates!  ?  ? ? ?A healthy democracy works best when [phone] participate! Make sure you are registered to vote! If you have moved or changed any of your contact information, you will need to get this updated before voting! ? ?In some cases, you MAY be able to register to vote online: [place] ? ? ? ? ? Airborne Adult Perc - 07/25/21 1355   ? ? Time Antigen Placed 1411   ? Allergen Manufacturer [email or web address]   ? Location Back   ? Number of Test 59   ? 1. Control-Buffer 50% Glycerol Negative   ? 2. Control-Histamine 1 mg/ml 2+   ? 3. Albumin saline Negative   ? 4. Bahia Negative   ? 5. 09/24/21 Negative   ? 6. Johnson Negative   ? 7. Kentucky Blue 3+   ? 8. Meadow Fescue 3+   ? 9. Perennial Rye 3+   ? 10. Sweet Vernal 3+   ? 11. Timothy 3+   ? 12. Cocklebur Negative   ? 13. Burweed Marshelder Negative   ? 14. Ragweed, short 3+   ? 15. Ragweed, Giant Negative   ? 16. Plantain,  English Negative   ? 17. Lamb's Quarters Negative   ? 18. Sheep Sorrell Negative   ? 19. Rough Pigweed Negative   ?  20. Marsh Elder, Rough Negative   ? 21. Mugwort, Common Negative   ? 22. Ash mix Negative   ? 23. Charletta Cousin mix Negative   ? 24. Beech American Negative   ? 25. Box, Elder Negative   ? 26. Cedar, red 3+   ? 27. Cottonwood, [place] Negative   ? 28. Elm mix Negative   ? 29. Hickory Negative   ? 30. Maple mix Negative   ? 31. Oak, [place] mix Negative   ? 32. Pecan Pollen Negative   ? 33. Pine mix Negative   ? 34. Sycamore Eastern Negative   ?  35. Walnut, Black Pollen Negative   ? 36. Alternaria alternata Negative   ? 37. Cladosporium Herbarum Negative   ? 38. Aspergillus mix Negative   ? 39. Penicillium mix Negative   ? 40. Bipolaris sorokiniana (Helminthosporium) Negative   ? 41. Drechslera spicifera (Curvularia) Negative   ? 42. Mucor plumbeus Negative   ? 43. Fusarium moniliforme Negative   ? 44. Aureobasidium pullulans (pullulara) Negative   ? 45. Rhizopus oryzae Negative   ? 46. Botrytis cinera Negative   ? 47. Epicoccum nigrum Negative   ? 48. Phoma betae Negative   ? 49. Candida Albicans Negative   ? 50. Trichophyton mentagrophytes Negative   ? 51. Mite, D Farinae  5,000 AU/ml Negative   ? 52. Mite, D Pteronyssinus  5,000 AU/ml Negative   ? 53. Cat Hair 10,000 BAU/ml 2+   ? 54.  Dog Epithelia Negative   ? 55. Mixed Feathers Negative   ? 56. Horse Epithelia Negative   ? 57. Cockroach, [place] Negative   ? 58. Mouse Negative   ? 59. Tobacco Leaf Negative   ? ?  ?  ? ?  ? ? Intradermal - 07/25/21 1435   ? ? Time Antigen Placed 1445   ? Allergen Manufacturer Waynette Buttery   ? Location Arm   ? Number of Test 12   ? Control Negative   ? [place] Negative   ? Johnson Negative   ? Weed mix 3+   ? Tree mix Negative   ? Mold 1 Negative   ? Mold 2 Negative   ? Mold 3 Negative   ? Mold 4 Negative   ? Dog Negative   ? Cockroach 2+   ? Mite mix 4+   ? ?  ?  ? ?  ? ?Reducing Pollen Exposure ? ?The American Academy of Allergy, Asthma and Immunology suggests the following steps to reduce your exposure to pollen during allergy seasons. ?   ?Do not hang sheets or clothing out to dry; pollen may collect on these items. ?Do not mow lawns or spend time around freshly cut grass; mowing stirs up pollen. ?Keep windows closed at night.  Keep car windows closed while driving. ?Minimize morning activities outdoors, a time when pollen counts are usually at their highest. ?Stay indoors as much as possible when pollen counts or humidity is high and on windy days when pollen tends to  remain in the air longer. ?Use air conditioning when possible.  Many air conditioners have filters that trap the pollen spores. ?Use a HEPA room air filter to remove pollen form the indoor air you breathe. ? ?Control of Dust Mite Allergen ? ? ? ?Dust mites play a major role in allergic asthma and rhinitis.  They occur in environments with high humidity wherever human skin is found.  Dust mites absorb humidity from the atmosphere (ie, they do not  drink) and feed on organic matter (including shed human and animal skin).  Dust mites are a microscopic type of insect that you cannot see with the naked eye.  High levels of dust mites have been detected from mattresses, pillows, carpets, upholstered furniture, bed covers, clothes, soft toys and any woven material.  The principal allergen of the dust mite is found in its feces.  A gram of dust may contain 1,000 mites and 250,000 fecal particles.  Mite antigen is easily measured in the air during house cleaning activities.  Dust mites do not bite and do not cause harm to humans, other than by triggering allergies/asthma. ?   ?Ways to decrease your exposure to dust mites in your home:  ?Encase mattresses, box springs and pillows with a mite-impermeable barrier or cover   ?Wash sheets, blankets and drapes weekly in hot water (130? F) with detergent and dry them in a dryer on the hot setting.  ?Have the room cleaned frequently with a vacuum cleaner and a damp dust-mop.  For carpeting or rugs, vacuuming with a vacuum cleaner equipped with a high-efficiency particulate air (HEPA) filter.  The dust mite allergic individual should not be in a room which is being cleaned and should wait 1 hour after cleaning before going into the room. ?Do not sleep on upholstered furniture (eg, couches).   ?If possible removing carpeting, upholstered furniture and drapery from the home is ideal.  Horizontal blinds should be eliminated in the rooms where the person spends the most time (bedroom,  study, television room).  Washable vinyl, roller-type shades are optimal. ?Remove all non-washable stuffed toys from the bedroom.  Wash stuffed toys weekly like sheets and blankets above.   ?Reduce indoor humi

## 2021-07-26 ENCOUNTER — Encounter: Payer: Self-pay | Admitting: Allergy & Immunology

## 2021-07-27 ENCOUNTER — Encounter: Payer: Self-pay | Admitting: Allergy & Immunology

## 2021-07-31 ENCOUNTER — Telehealth: Payer: Self-pay | Admitting: Internal Medicine

## 2021-07-31 NOTE — Telephone Encounter (Signed)
Pharmacy called in requesting a diagnosis or code for ALPRAZolam (XANAX) 1 MG tablet.  ? ?Please call back with diagnosis ASAP.  ?

## 2021-07-31 NOTE — Telephone Encounter (Signed)
F41.1 Pharmacy has been notified ?

## 2021-08-01 MED ORDER — MONTELUKAST SODIUM 10 MG PO TABS: 10.0000 mg | ORAL_TABLET | Freq: Every day | ORAL | 5 refills | Status: DC

## 2021-08-01 MED ORDER — TRIAMCINOLONE ACETONIDE 0.5 % EX OINT: 1.0000 "application " | TOPICAL_OINTMENT | Freq: Two times a day (BID) | CUTANEOUS | 5 refills | Status: DC

## 2021-08-01 NOTE — Telephone Encounter (Signed)
Called patient to let her know about medication being sent in for the swelling. Patient stated that the swelling has gone down now. She inquired about the medication that was on her AVS that she did not get yet. Per AVS Singular 10 mg was to be started. Medication was sent into the pharmacy that was on file and confirmed with patient.  ?

## 2021-08-09 ENCOUNTER — Other Ambulatory Visit: Payer: Self-pay | Admitting: Internal Medicine

## 2021-08-09 DIAGNOSIS — F988 Other specified behavioral and emotional disorders with onset usually occurring in childhood and adolescence: Secondary | ICD-10-CM

## 2021-08-09 MED ORDER — AMPHETAMINE-DEXTROAMPHETAMINE 15 MG PO TABS: 15.0000 mg | ORAL_TABLET | Freq: Every day | ORAL | 0 refills | Status: DC

## 2021-09-02 ENCOUNTER — Other Ambulatory Visit: Payer: Self-pay | Admitting: Internal Medicine

## 2021-09-02 DIAGNOSIS — F988 Other specified behavioral and emotional disorders with onset usually occurring in childhood and adolescence: Secondary | ICD-10-CM

## 2021-09-04 MED ORDER — AMPHETAMINE-DEXTROAMPHETAMINE 15 MG PO TABS: 15.0000 mg | ORAL_TABLET | Freq: Every day | ORAL | 0 refills | Status: DC

## 2021-09-07 ENCOUNTER — Ambulatory Visit: Payer: Self-pay | Admitting: Allergy & Immunology

## 2021-10-06 ENCOUNTER — Other Ambulatory Visit: Payer: Self-pay | Admitting: Internal Medicine

## 2021-10-06 DIAGNOSIS — F988 Other specified behavioral and emotional disorders with onset usually occurring in childhood and adolescence: Secondary | ICD-10-CM

## 2021-10-06 MED ORDER — AMPHETAMINE-DEXTROAMPHETAMINE 15 MG PO TABS: 15.0000 mg | ORAL_TABLET | Freq: Every day | ORAL | 0 refills | Status: DC

## 2021-10-27 ENCOUNTER — Other Ambulatory Visit: Payer: Self-pay | Admitting: Internal Medicine

## 2021-11-03 ENCOUNTER — Other Ambulatory Visit: Payer: Self-pay | Admitting: Internal Medicine

## 2021-11-03 DIAGNOSIS — F988 Other specified behavioral and emotional disorders with onset usually occurring in childhood and adolescence: Secondary | ICD-10-CM

## 2021-11-03 MED ORDER — AMPHETAMINE-DEXTROAMPHETAMINE 15 MG PO TABS: 15.0000 mg | ORAL_TABLET | Freq: Every day | ORAL | 0 refills | Status: DC

## 2021-12-05 ENCOUNTER — Other Ambulatory Visit: Payer: Self-pay | Admitting: Internal Medicine

## 2021-12-05 DIAGNOSIS — F988 Other specified behavioral and emotional disorders with onset usually occurring in childhood and adolescence: Secondary | ICD-10-CM

## 2021-12-05 MED ORDER — AMPHETAMINE-DEXTROAMPHETAMINE 15 MG PO TABS: 15.0000 mg | ORAL_TABLET | Freq: Every day | ORAL | 0 refills | Status: DC

## 2022-01-04 ENCOUNTER — Other Ambulatory Visit: Payer: Self-pay | Admitting: Internal Medicine

## 2022-01-04 DIAGNOSIS — F988 Other specified behavioral and emotional disorders with onset usually occurring in childhood and adolescence: Secondary | ICD-10-CM

## 2022-01-05 MED ORDER — AMPHETAMINE-DEXTROAMPHETAMINE 15 MG PO TABS: 15.0000 mg | ORAL_TABLET | Freq: Every day | ORAL | 0 refills | Status: DC

## 2022-01-25 ENCOUNTER — Ambulatory Visit: Payer: BC Managed Care – PPO | Admitting: Allergy & Immunology

## 2022-02-03 ENCOUNTER — Other Ambulatory Visit: Payer: Self-pay | Admitting: Internal Medicine

## 2022-02-03 DIAGNOSIS — F988 Other specified behavioral and emotional disorders with onset usually occurring in childhood and adolescence: Secondary | ICD-10-CM

## 2022-02-06 ENCOUNTER — Other Ambulatory Visit: Payer: Self-pay | Admitting: Internal Medicine

## 2022-02-06 DIAGNOSIS — F988 Other specified behavioral and emotional disorders with onset usually occurring in childhood and adolescence: Secondary | ICD-10-CM

## 2022-02-09 ENCOUNTER — Other Ambulatory Visit: Payer: Self-pay | Admitting: Internal Medicine

## 2022-02-09 ENCOUNTER — Encounter: Payer: Self-pay | Admitting: Internal Medicine

## 2022-02-09 DIAGNOSIS — F988 Other specified behavioral and emotional disorders with onset usually occurring in childhood and adolescence: Secondary | ICD-10-CM

## 2022-02-12 ENCOUNTER — Other Ambulatory Visit: Payer: Self-pay | Admitting: Internal Medicine

## 2022-02-12 DIAGNOSIS — F988 Other specified behavioral and emotional disorders with onset usually occurring in childhood and adolescence: Secondary | ICD-10-CM

## 2022-02-12 MED ORDER — AMPHETAMINE-DEXTROAMPHETAMINE 15 MG PO TABS: 15.0000 mg | ORAL_TABLET | Freq: Every day | ORAL | 0 refills | Status: DC

## 2022-03-06 ENCOUNTER — Other Ambulatory Visit: Payer: Self-pay | Admitting: Internal Medicine

## 2022-03-06 ENCOUNTER — Ambulatory Visit (INDEPENDENT_AMBULATORY_CARE_PROVIDER_SITE_OTHER): Payer: BC Managed Care – PPO | Admitting: Internal Medicine

## 2022-03-06 VITALS — BP 130/78 | HR 106 | Temp 97.8°F | Ht 65.35 in | Wt 189.0 lb

## 2022-03-06 DIAGNOSIS — E538 Deficiency of other specified B group vitamins: Secondary | ICD-10-CM | POA: Diagnosis not present

## 2022-03-06 DIAGNOSIS — E559 Vitamin D deficiency, unspecified: Secondary | ICD-10-CM

## 2022-03-06 DIAGNOSIS — R739 Hyperglycemia, unspecified: Secondary | ICD-10-CM | POA: Diagnosis not present

## 2022-03-06 DIAGNOSIS — F988 Other specified behavioral and emotional disorders with onset usually occurring in childhood and adolescence: Secondary | ICD-10-CM | POA: Diagnosis not present

## 2022-03-06 DIAGNOSIS — F32A Depression, unspecified: Secondary | ICD-10-CM

## 2022-03-06 DIAGNOSIS — Z0001 Encounter for general adult medical examination with abnormal findings: Secondary | ICD-10-CM | POA: Diagnosis not present

## 2022-03-06 DIAGNOSIS — E7800 Pure hypercholesterolemia, unspecified: Secondary | ICD-10-CM

## 2022-03-06 DIAGNOSIS — R7989 Other specified abnormal findings of blood chemistry: Secondary | ICD-10-CM

## 2022-03-06 LAB — TSH: TSH: 1.3 u[IU]/mL (ref 0.35–5.50)

## 2022-03-06 LAB — BASIC METABOLIC PANEL
BUN: 10 mg/dL (ref 6–23)
CO2: 27 mEq/L (ref 19–32)
Calcium: 9.3 mg/dL (ref 8.4–10.5)
Chloride: 100 mEq/L (ref 96–112)
Creatinine, Ser: 0.7 mg/dL (ref 0.40–1.20)
GFR: 109.1 mL/min (ref >60.00)
Glucose, Bld: 98 mg/dL (ref 70–99)
Potassium: 3.9 mEq/L (ref 3.5–5.1)
Sodium: 137 mEq/L (ref 135–145)

## 2022-03-06 LAB — HEPATIC FUNCTION PANEL
ALT: 97 U/L — ABNORMAL HIGH (ref 0–35)
AST: 88 U/L — ABNORMAL HIGH (ref 0–37)
Albumin: 4.6 g/dL (ref 3.5–5.2)
Alkaline Phosphatase: 42 U/L (ref 39–117)
Bilirubin, Direct: 0.1 mg/dL (ref 0.0–0.3)
Total Bilirubin: 0.7 mg/dL (ref 0.2–1.2)
Total Protein: 7.2 g/dL (ref 6.0–8.3)

## 2022-03-06 LAB — URINALYSIS, ROUTINE W REFLEX MICROSCOPIC
Bilirubin Urine: NEGATIVE
Hgb urine dipstick: NEGATIVE
Ketones, ur: NEGATIVE
Leukocytes,Ua: NEGATIVE
Nitrite: NEGATIVE
RBC / HPF: NONE SEEN (ref 0–?)
Specific Gravity, Urine: 1.025 (ref 1.000–1.030)
Total Protein, Urine: NEGATIVE
Urine Glucose: NEGATIVE
Urobilinogen, UA: 0.2 (ref 0.0–1.0)
WBC, UA: NONE SEEN (ref 0–?)
pH: 6 (ref 5.0–8.0)

## 2022-03-06 LAB — CBC WITH DIFFERENTIAL/PLATELET
Basophils Absolute: 0 10*3/uL (ref 0.0–0.1)
Basophils Relative: 1 % (ref 0.0–3.0)
Eosinophils Absolute: 0.1 10*3/uL (ref 0.0–0.7)
Eosinophils Relative: 3.5 % (ref 0.0–5.0)
HCT: 40.1 % (ref 36.0–46.0)
Hemoglobin: 13.8 g/dL (ref 12.0–15.0)
Lymphocytes Relative: 41 % (ref 12.0–46.0)
Lymphs Abs: 1.6 10*3/uL (ref 0.7–4.0)
MCHC: 34.4 g/dL (ref 30.0–36.0)
MCV: 101.1 fl — ABNORMAL HIGH (ref 78.0–100.0)
Monocytes Absolute: 0.5 10*3/uL (ref 0.1–1.0)
Monocytes Relative: 11.9 % (ref 3.0–12.0)
Neutro Abs: 1.7 10*3/uL (ref 1.4–7.7)
Neutrophils Relative %: 42.6 % — ABNORMAL LOW (ref 43.0–77.0)
Platelets: 223 10*3/uL (ref 150.0–400.0)
RBC: 3.96 Mil/uL (ref 3.87–5.11)
RDW: 13.1 % (ref 11.5–15.5)
WBC: 4 10*3/uL (ref 4.0–10.5)

## 2022-03-06 LAB — LDL CHOLESTEROL, DIRECT: Direct LDL: 144 mg/dL

## 2022-03-06 LAB — LIPID PANEL
Cholesterol: 246 mg/dL — ABNORMAL HIGH (ref 0–200)
HDL: 71.6 mg/dL (ref >39.00)
NonHDL: 174.81
Total CHOL/HDL Ratio: 3
Triglycerides: 368 mg/dL — ABNORMAL HIGH (ref 0.0–149.0)
VLDL: 73.6 mg/dL — ABNORMAL HIGH (ref 0.0–40.0)

## 2022-03-06 LAB — VITAMIN D 25 HYDROXY (VIT D DEFICIENCY, FRACTURES): VITD: 41.73 ng/mL (ref 30.00–100.00)

## 2022-03-06 LAB — HEMOGLOBIN A1C: Hgb A1c MFr Bld: 5.8 % (ref 4.6–6.5)

## 2022-03-06 LAB — VITAMIN B12: Vitamin B-12: 482 pg/mL (ref 211–911)

## 2022-03-06 MED ORDER — ALPRAZOLAM 1 MG PO TABS: ORAL_TABLET | ORAL | 1 refills | Status: DC

## 2022-03-06 MED ORDER — AMPHETAMINE-DEXTROAMPHETAMINE 15 MG PO TABS: 15.0000 mg | ORAL_TABLET | Freq: Every day | ORAL | 0 refills | Status: DC

## 2022-03-06 NOTE — Assessment & Plan Note (Signed)
Currently stable, declines further need for SSRI at this time

## 2022-03-06 NOTE — Assessment & Plan Note (Signed)
Age and sex appropriate education and counseling updated with regular exercise and diet Referrals for preventative services - plans to see GYN soon for pap Immunizations addressed - declines flu shot Smoking counseling  - none needed Evidence for depression or other mood disorder - stable anxiety, depression with xanax prn, declines further SSRI Most recent labs reviewed. I have personally reviewed and have noted: 1) the patient's medical and social history 2) The patient's current medications and supplements 3) The patient's height, weight, and BMI have been recorded in the chart

## 2022-03-06 NOTE — Progress Notes (Signed)
Patient ID: Dana Cervantes, female   DOB: 1980-07-28, 41 y.o.   MRN: [id number]         Chief Complaint:: wellness exam and ADD, hld       HPI:  Dana Cervantes is a 41 y.o. female here for wellness exam; plans to [facility] soon.  Declines flu shots.  O/w up to date                        Also has new recent allergy found to grass, cedar trees and dust mites per allergist after face swelling.  Denies worsening depressive symptoms, suicidal ideation, or panic; has ongoing anxiety, not increased recently.   ADD med working well, needs fill.  Pt denies chest pain, increased sob or doe, wheezing, orthopnea, PND, increased LE swelling, palpitations, dizziness or syncope.   Pt denies polydipsia, polyuria, or new focal neuro s/s.    Pt denies fever, wt loss, night sweats, loss of appetite, or other constitutional symptoms     Wt Readings from Last 3 Encounters:  03/06/22 189 lb (85.7 kg)  07/25/21 176 lb 9.6 oz (80.1 kg)  06/30/21 176 lb (79.8 kg)   BP Readings from Last 3 Encounters:  03/06/22 130/78  07/25/21 (!) 158/90  06/30/21 130/78   Immunization History  Administered Date(s) Administered   Influenza,inj,Quad PF,6+ Mos 01/20/2013, 01/27/2014, 12/21/2015, 12/25/2016, 12/30/2017, 01/08/2019   Td 06/03/2008   Tdap 01/08/2019  There are no preventive care reminders to display for this patient.    Past Medical History:  Diagnosis Date   ADD 11/09/2006   ALLERGIC RHINITIS 12/08/2008   ANEMIA-IRON DEFICIENCY 06/03/2008   ANXIETY 11/09/2006   DEPRESSION 11/09/2006   ECZEMA 06/02/2009   HYPERLIPIDEMIA 06/03/2008   MIGRAINE HEADACHE 11/09/2006   Urticaria    Past Surgical History:  Procedure Laterality Date   s/p IUD     TONSILLECTOMY      reports that she has never smoked. She has never been exposed to tobacco smoke. She has never used smokeless tobacco. She reports that she does not drink alcohol and does not use drugs. family history includes Allergic rhinitis in her father;  Cancer in her father; Hyperlipidemia in her father. No Known Allergies Current Outpatient Medications on File Prior to Visit  Medication Sig Dispense Refill   azelastine (OPTIVAR) 0.05 % ophthalmic solution Place 1 drop into both eyes 2 (two) times daily. 6 mL 12   Cholecalciferol (VITAMIN D3 PO) Take 5,000 Int'l Units/day by mouth daily.     fluticasone (FLONASE) 50 MCG/ACT nasal spray Place 1-2 sprays into both nostrils daily.     ibuprofen (ADVIL) 200 MG tablet Take 200 mg by mouth every 6 (six) hours as needed.     levonorgestrel (MIRENA, 52 MG,) 20 MCG/24HR IUD Mirena 20 mcg/24 hours (6 yrs) 52 mg intrauterine device  Take 1 device by intrauterine route.     LORATADINE PO Take 10 mg by mouth daily. Equate allergy     triamcinolone cream (KENALOG) 0.1 % Apply 1 application topically 2 (two) times daily. 30 g 1   triamcinolone ointment (KENALOG) 0.5 % Apply 1 application. topically 2 (two) times daily. 30 g 5   UNABLE TO FIND Take 10 mg by mouth daily. Equate allergy relief 5 mg cetrizine/20 mg of decongestant     vitamin C (ASCORBIC ACID) 500 MG tablet Take 500 mg by mouth daily.     No current facility-administered medications on file prior  to visit.        ROS:  All others reviewed and negative.  Objective        PE:  BP 130/78 (BP Location: Right Arm, Patient Position: Sitting, Cuff Size: Large)   Pulse (!) 106   Temp 97.8 F (36.6 C) (Oral)   Ht 5' 5.35" (1.66 m)   Wt 189 lb (85.7 kg)   SpO2 96%   BMI 31.12 kg/m                 Constitutional: Pt appears in NAD               HENT: Head: NCAT.                Right Ear: External ear normal.                 Left Ear: External ear normal.                Eyes: . Pupils are equal, round, and reactive to light. Conjunctivae and EOM are normal               Nose: without d/c or deformity               Neck: Neck supple. Gross normal ROM               Cardiovascular: Normal rate and regular rhythm.                  Pulmonary/Chest: Effort normal and breath sounds without rales or wheezing.                Abd:  Soft, NT, ND, + BS, no organomegaly               Neurological: Pt is alert. At baseline orientation, motor grossly intact               Skin: Skin is warm. No rashes, no other new lesions, LE edema - none               Psychiatric: Pt behavior is normal without agitation   Micro: none  Cardiac tracings I have personally interpreted today:  none  Pertinent Radiological findings (summarize): none   Lab Results  Component Value Date   WBC 6.2 03/01/2021   HGB 13.4 03/01/2021   HCT 38.6 03/01/2021   PLT 219.0 03/01/2021   GLUCOSE 95 03/01/2021   CHOL 226 (H) 03/01/2021   TRIG 199.0 (H) 03/01/2021   HDL 76.40 03/01/2021   LDLCALC 110 (H) 03/01/2021   ALT 61 (H) 03/01/2021   AST 42 (H) 03/01/2021   NA 136 03/01/2021   K 4.0 03/01/2021   CL 98 03/01/2021   CREATININE 0.56 03/01/2021   BUN 14 03/01/2021   CO2 28 03/01/2021   TSH 1.10 03/01/2021   Assessment/Plan:  Dana Cervantes is a 41 y.o. White or Caucasian [1] female with  has a past medical history of ADD (11/09/2006), ALLERGIC RHINITIS (12/08/2008), ANEMIA-IRON DEFICIENCY (06/03/2008), ANXIETY (11/09/2006), DEPRESSION (11/09/2006), ECZEMA (06/02/2009), HYPERLIPIDEMIA (06/03/2008), MIGRAINE HEADACHE (11/09/2006), and Urticaria.  Encounter for well adult exam with abnormal findings Age and sex appropriate education and counseling updated with regular exercise and diet Referrals for preventative services - plans to see GYN soon for pap Immunizations addressed - declines flu shot Smoking counseling  - none needed Evidence for depression or other mood disorder - stable anxiety, depression with xanax prn, declines further SSRI Most recent  labs reviewed. I have personally reviewed and have noted: 1) the patient's medical and social history 2) The patient's current medications and supplements 3) The patient's height, weight, and BMI  have been recorded in the chart   Depression Currently stable, declines further need for SSRI at this time  Hyperlipidemia Lab Results  Component Value Date   [place] 110 (H) 03/01/2021   uncontrolled, pt to continue current lower chol diet, f/u lab today, declines statin for now   Attention deficit disorder Stable overall, to continue the adderal 15 mg qd  Followup: No follow-ups on file.  Cathlean Cower, MD 03/06/2022 9:43 AM [facility]

## 2022-03-06 NOTE — Assessment & Plan Note (Signed)
Stable overall, to continue the adderal 15 mg qd

## 2022-03-06 NOTE — Assessment & Plan Note (Addendum)
Lab Results  Component Value Date   LDLCALC 110 (H) 03/01/2021   uncontrolled, pt to continue current lower chol diet, f/u lab today, declines statin for now

## 2022-03-06 NOTE — Patient Instructions (Addendum)
Please remember to contact your GYN for your follow up appointment  Please continue all other medications as before, and refills have been done for xanax and adderall  Please have the pharmacy call with any other refills you may need.  Please continue your efforts at being more active, low cholesterol diet, and weight control.  You are otherwise up to date with prevention measures today.  Please keep your appointments with your specialists as you may have planned  Please go to the LAB at the blood drawing area for the tests to be done  You will be contacted by phone if any changes need to be made immediately.  Otherwise, you will receive a letter about your results with an explanation, but please check with MyChart first.  Please remember to sign up for MyChart if you have not done so, as this will be important to you in the future with finding out test results, communicating by private email, and scheduling acute appointments online when needed.  Please make an Appointment to return for your 1 year visit, or sooner if needed

## 2022-03-07 DIAGNOSIS — Z01419 Encounter for gynecological examination (general) (routine) without abnormal findings: Secondary | ICD-10-CM | POA: Diagnosis not present

## 2022-03-07 DIAGNOSIS — Z1389 Encounter for screening for other disorder: Secondary | ICD-10-CM | POA: Diagnosis not present

## 2022-03-15 ENCOUNTER — Other Ambulatory Visit: Payer: Self-pay | Admitting: Internal Medicine

## 2022-03-15 DIAGNOSIS — F988 Other specified behavioral and emotional disorders with onset usually occurring in childhood and adolescence: Secondary | ICD-10-CM

## 2022-04-15 ENCOUNTER — Other Ambulatory Visit: Payer: Self-pay | Admitting: Internal Medicine

## 2022-04-15 DIAGNOSIS — F988 Other specified behavioral and emotional disorders with onset usually occurring in childhood and adolescence: Secondary | ICD-10-CM

## 2022-04-16 MED ORDER — AMPHETAMINE-DEXTROAMPHETAMINE 15 MG PO TABS: 15.0000 mg | ORAL_TABLET | Freq: Every day | ORAL | 0 refills | Status: DC

## 2022-05-02 ENCOUNTER — Encounter: Payer: Self-pay | Admitting: Nurse Practitioner

## 2022-05-15 ENCOUNTER — Other Ambulatory Visit: Payer: Self-pay | Admitting: Internal Medicine

## 2022-05-15 DIAGNOSIS — F988 Other specified behavioral and emotional disorders with onset usually occurring in childhood and adolescence: Secondary | ICD-10-CM

## 2022-05-15 MED ORDER — AMPHETAMINE-DEXTROAMPHETAMINE 15 MG PO TABS: 15.0000 mg | ORAL_TABLET | Freq: Every day | ORAL | 0 refills | Status: DC

## 2022-05-15 NOTE — Telephone Encounter (Signed)
LOV 03/06/22

## 2022-06-04 ENCOUNTER — Encounter: Payer: Self-pay | Admitting: Nurse Practitioner

## 2022-06-04 ENCOUNTER — Other Ambulatory Visit (INDEPENDENT_AMBULATORY_CARE_PROVIDER_SITE_OTHER): Payer: BC Managed Care – PPO

## 2022-06-04 ENCOUNTER — Ambulatory Visit (INDEPENDENT_AMBULATORY_CARE_PROVIDER_SITE_OTHER): Payer: BC Managed Care – PPO | Admitting: Nurse Practitioner

## 2022-06-04 VITALS — BP 110/74 | HR 96 | Ht 65.0 in | Wt 182.4 lb

## 2022-06-04 DIAGNOSIS — R7401 Elevation of levels of liver transaminase levels: Secondary | ICD-10-CM | POA: Diagnosis not present

## 2022-06-04 DIAGNOSIS — R799 Abnormal finding of blood chemistry, unspecified: Secondary | ICD-10-CM | POA: Diagnosis not present

## 2022-06-04 DIAGNOSIS — R7989 Other specified abnormal findings of blood chemistry: Secondary | ICD-10-CM

## 2022-06-04 LAB — HEPATIC FUNCTION PANEL
ALT: 48 U/L — ABNORMAL HIGH (ref 0–35)
AST: 22 U/L (ref 0–37)
Albumin: 4.7 g/dL (ref 3.5–5.2)
Alkaline Phosphatase: 41 U/L (ref 39–117)
Bilirubin, Direct: 0.1 mg/dL (ref 0.0–0.3)
Total Bilirubin: 0.5 mg/dL (ref 0.2–1.2)
Total Protein: 7.6 g/dL (ref 6.0–8.3)

## 2022-06-04 LAB — IRON: Iron: 88 ug/dL (ref 42–145)

## 2022-06-04 LAB — FERRITIN: Ferritin: 39.1 ng/mL (ref 10.0–291.0)

## 2022-06-04 NOTE — Progress Notes (Signed)
06/04/2022 Dana Cervantes [id number] 21-Nov-1980   CHIEF COMPLAINT: Abnormal LFTs  HISTORY OF PRESENT ILLNESS: Dana Cervantes is a 42 year old female with a past medical history of anxiety, depression, hyperlipidemia, right thyroid nodule and anemia (resolved 14 years ago). She presents to our office today as referred by Dr. Cathlean Cower for further evaluation regarding elevated LFTs.  She underwent routine laboratory studies 01/08/2019 which showed a total bilirubin level 1.8.  AST 173.  ALT 122.  Abdominal sonogram was recommended but was not completed.  Repeat laboratory studies 03/01/2021 showed a AST level of 42.  ALT 61.  Laboratory studies 03/06/2022 showed AST level 88 and ALT 97.  During the COVID pandemic, she endorsed drinking 2 glasses of wine or 2 beers most nights.  However, within the past 6 months she has reduced her alcohol intake, drinks 1 or 2 alcoholic beverages every 2 to 4 weeks.  No drug use.  She eats a high fat diet/high cholesterol diet consisting of red meat and cheese.  No antibiotic use within the past 6 months.  No herbal supplements.  She took Advil 200 mg 2 tabs most days for 2 days for generalized body aches which she stopped around 02/2022.  No history of excessive Tylenol use.  No known family history of liver disease.  She denies having any nausea or vomiting.  No upper or lower abdominal pain. She is passing normal brown bowel movements daily.  No rectal bleeding or black stools.  No other complaints today.  She reported having anemia as a teenager which abated after her last pregnancy 14 years ago.     Latest Ref Rng & Units 03/06/2022    9:42 AM 03/01/2021    3:47 PM 02/26/2019    8:58 AM  CMP  Glucose 70 - 99 mg/dL 98  95    BUN 6 - 23 mg/dL 10  14    Creatinine 0.40 - 1.20 mg/dL 0.70  0.56    Sodium 135 - 145 mEq/L 137  136    Potassium 3.5 - 5.1 mEq/L 3.9  4.0    Chloride 96 - 112 mEq/L 100  98    CO2 19 - 32 mEq/L 27  28    Calcium 8.4 - 10.5  mg/dL 9.3  10.1    Total Protein 6.0 - 8.3 g/dL 7.2  7.6  7.0   Total Bilirubin 0.2 - 1.2 mg/dL 0.7  0.9  0.6   Alkaline Phos 39 - 117 U/L 42  47  31   AST 0 - 37 U/L 88  42  18   ALT 0 - 35 U/L 97  61  26        Latest Ref Rng & Units 03/06/2022    9:42 AM 03/01/2021    3:47 PM 01/08/2019    3:20 PM  CBC  WBC 4.0 - 10.5 K/uL 4.0  6.2  7.9   Hemoglobin 12.0 - 15.0 g/dL 13.8  13.4  13.9   Hematocrit 36.0 - 46.0 % 40.1  38.6  39.8   Platelets 150.0 - 400.0 K/uL 223.0  219.0  291.0     Past Medical History:  Diagnosis Date   ADD 11/09/2006   ALLERGIC RHINITIS 12/08/2008   ANEMIA-IRON DEFICIENCY 06/03/2008   ANXIETY 11/09/2006   DEPRESSION 11/09/2006   ECZEMA 06/02/2009   HYPERLIPIDEMIA 06/03/2008   MIGRAINE HEADACHE 11/09/2006   Urticaria    Past Surgical History:  Procedure Laterality Date  s/p IUD     TONSILLECTOMY    Cervical scraping   Social History: She is married.  She works at [employer].  She has 2 sons and 2 daughters ages 64, 30, 68 and 27. Remote cigarette smoking as a teenager.  See HPI.  No drug use.  Family History: Father skin cancer and hyperlipidemia. No known family history of esophageal, gastric, colon, pancreatic or liver cancer.  No Known Allergies   Outpatient Encounter Medications as of 06/04/2022  Medication Sig   ALPRAZolam (XANAX) 1 MG tablet TAKE 1/2 TO 1 (ONE-HALF TO ONE) TABLET BY MOUTH ONCE DAILY AS NEEDED   amphetamine-dextroamphetamine (ADDERALL) 15 MG tablet Take 1 tablet by mouth daily.   azelastine (OPTIVAR) 0.05 % ophthalmic solution Place 1 drop into both eyes 2 (two) times daily.   Cholecalciferol (VITAMIN D3 PO) Take 5,000 Int'l Units/day by mouth daily.   fluticasone (FLONASE) 50 MCG/ACT nasal spray Place 1-2 sprays into both nostrils daily.   ibuprofen (ADVIL) 200 MG tablet Take 200 mg by mouth every 6 (six) hours as needed.   levonorgestrel (MIRENA, 52 MG,) 20 MCG/24HR IUD Mirena 20 mcg/24 hours (6 yrs) 52 mg intrauterine  device  Take 1 device by intrauterine route.   LORATADINE PO Take 10 mg by mouth daily. Equate allergy   triamcinolone cream (KENALOG) 0.1 % Apply 1 application topically 2 (two) times daily.   triamcinolone ointment (KENALOG) 0.5 % Apply 1 application. topically 2 (two) times daily.   UNABLE TO FIND Take 10 mg by mouth daily. Equate allergy relief 5 mg cetrizine/20 mg of decongestant   vitamin C (ASCORBIC ACID) 500 MG tablet Take 500 mg by mouth daily.   No facility-administered encounter medications on file as of 06/04/2022.   REVIEW OF SYSTEMS:  Gen: Denies fever, sweats or chills. No weight loss.  CV: Denies chest pain, palpitations or edema. Resp: Denies cough, shortness of breath of hemoptysis.  GI: Denies heartburn, dysphagia, stomach or lower abdominal pain. No diarrhea or constipation.  GU : + Urine leakage. MS: Denies joint pain, muscles aches or weakness. Derm: Denies rash, itchiness, skin lesions or unhealing ulcers. Psych: Denies depression, anxiety, memory loss or confusion. Heme: Denies bruising, easy bleeding. Neuro:  Denies headaches, dizziness or paresthesias. Endo:  + Thyroid enlargement, nodule.  PHYSICAL EXAM: BP 110/74   Pulse 96   Ht 5\' 5"  (1.651 m)   Wt 182 lb 6.4 oz (82.7 kg)   BMI 30.35 kg/m   General: 42 year old female in no acute distress. Head: Normocephalic and atraumatic. Eyes:  Sclerae non-icteric, conjunctive pink. Ears: Normal auditory acuity. Mouth: Dentition intact. No ulcers or lesions.  Neck: + Thyromegaly. Lungs: Clear bilaterally to auscultation without wheezes, crackles or rhonchi. Heart: Regular rate and rhythm. No murmur, rub or gallop appreciated.  Abdomen: Soft, nontender, nondistended. No masses. No hepatosplenomegaly.  Normoactive bowel sounds to all 4 quadrants. Extremities: No edema Skin: No jaundice or lesions.  Assessment and Recommendations:  42 year old female with chronic elevated LFTs prior alcohol intake likely a  contributing factor.  Suspect component of hepatic steatosis. -RUQ sonogram to evaluate the liver -No alcohol -Reduce red meat/cheese and carbohydrates in diet -Exercise as tolerated, weight loss recommended -Hepatic panel, hepatitis A total antibody, hepatitis B surface antigen, hepatitis B surface antibody, hepatitis B core total antibody, hepatitis C antibody, ANA, AMA, SMA, IgG, alpha-1 antitrypsin, ceruloplasmin, ferritin, iron, TTG and IgA level -Further recommendations and follow-up to be determined after the above ultrasound and lab results reviewed

## 2022-06-04 NOTE — Patient Instructions (Signed)
You have been scheduled for an abdominal ultrasound at [facility] (1st floor of hospital) on _____________ at _________________. Please arrive 30 minutes prior to your appointment for registration. Make certain not to have anything to eat or drink 6 hours prior to your appointment. Should you need to reschedule your appointment, please contact radiology at [phone]. This test typically takes about 30 minutes to perform.  Your provider has requested that you go to the basement level for lab work before leaving today. Press "B" on the elevator. The lab is located at the first door on the left as you exit the elevator.   No alcohol intake.  Due to recent changes in healthcare laws, you may see the results of your imaging and laboratory studies on MyChart before your provider has had a chance to review them.  We understand that in some cases there may be results that are confusing or concerning to you. Not all laboratory results come back in the same time frame and the provider may be waiting for multiple results in order to interpret others.  Please give [place] 48 hours in order for your provider to thoroughly review all the results before contacting the office for clarification of your results.    Thank you for trusting me with your gastrointestinal care!   Carl Best, CRNP

## 2022-06-05 NOTE — Progress Notes (Signed)
I agree with the assessment and plan as outlined by Ms. Kennedy-Smith. 

## 2022-06-06 ENCOUNTER — Other Ambulatory Visit: Payer: Self-pay | Admitting: Internal Medicine

## 2022-06-06 DIAGNOSIS — F988 Other specified behavioral and emotional disorders with onset usually occurring in childhood and adolescence: Secondary | ICD-10-CM

## 2022-06-06 MED ORDER — AMPHETAMINE-DEXTROAMPHETAMINE 15 MG PO TABS: 15.0000 mg | ORAL_TABLET | Freq: Every day | ORAL | 0 refills | Status: DC

## 2022-06-07 LAB — ANTI-SMOOTH MUSCLE ANTIBODY, IGG: Actin (Smooth Muscle) Antibody (IGG): <20 U (ref <20)

## 2022-06-07 LAB — ANA: Anti Nuclear Antibody (ANA): NEGATIVE

## 2022-06-07 LAB — TISSUE TRANSGLUTAMINASE ABS,IGG,IGA
(tTG) Ab, IgA: <1 U/mL
(tTG) Ab, IgG: <1 U/mL

## 2022-06-07 LAB — ALPHA-1-ANTITRYPSIN: A-1 Antitrypsin, Ser: 116 mg/dL (ref 83–199)

## 2022-06-07 LAB — MITOCHONDRIAL ANTIBODIES: Mitochondrial M2 Ab, IgG: <=20 U (ref <=20.0)

## 2022-06-07 LAB — HEPATITIS B SURFACE ANTIBODY,QUALITATIVE: Hep B S Ab: REACTIVE — AB

## 2022-06-07 LAB — IGA: Immunoglobulin A: 122 mg/dL (ref 47–310)

## 2022-06-07 LAB — HEPATITIS C ANTIBODY: Hepatitis C Ab: NONREACTIVE

## 2022-06-07 LAB — CERULOPLASMIN: Ceruloplasmin: 28 mg/dL (ref 18–53)

## 2022-06-07 LAB — HEPATITIS A ANTIBODY, TOTAL: Hepatitis A AB,Total: NONREACTIVE

## 2022-06-07 LAB — HEPATITIS B CORE ANTIBODY, TOTAL: Hep B Core Total Ab: NONREACTIVE

## 2022-06-07 LAB — IGG: IgG (Immunoglobin G), Serum: 971 mg/dL (ref 600–1640)

## 2022-06-07 LAB — HEPATITIS B SURFACE ANTIGEN: Hepatitis B Surface Ag: NONREACTIVE

## 2022-06-13 ENCOUNTER — Telehealth: Payer: Self-pay

## 2022-06-13 NOTE — Telephone Encounter (Signed)
Contacted pt and pt is aware and understanding of labs results.

## 2022-06-20 ENCOUNTER — Other Ambulatory Visit: Payer: BC Managed Care – PPO

## 2022-07-15 ENCOUNTER — Other Ambulatory Visit: Payer: Self-pay | Admitting: Internal Medicine

## 2022-07-15 DIAGNOSIS — F988 Other specified behavioral and emotional disorders with onset usually occurring in childhood and adolescence: Secondary | ICD-10-CM

## 2022-07-16 MED ORDER — AMPHETAMINE-DEXTROAMPHETAMINE 15 MG PO TABS: 15.0000 mg | ORAL_TABLET | Freq: Every day | ORAL | 0 refills | Status: DC

## 2022-08-13 ENCOUNTER — Other Ambulatory Visit: Payer: Self-pay | Admitting: Internal Medicine

## 2022-08-13 DIAGNOSIS — F988 Other specified behavioral and emotional disorders with onset usually occurring in childhood and adolescence: Secondary | ICD-10-CM

## 2022-08-14 MED ORDER — AMPHETAMINE-DEXTROAMPHETAMINE 15 MG PO TABS: 15.0000 mg | ORAL_TABLET | Freq: Every day | ORAL | 0 refills | Status: DC

## 2022-09-12 ENCOUNTER — Other Ambulatory Visit: Payer: Self-pay | Admitting: Internal Medicine

## 2022-09-12 DIAGNOSIS — F988 Other specified behavioral and emotional disorders with onset usually occurring in childhood and adolescence: Secondary | ICD-10-CM

## 2022-09-12 MED ORDER — AMPHETAMINE-DEXTROAMPHETAMINE 15 MG PO TABS: 15.0000 mg | ORAL_TABLET | Freq: Every day | ORAL | 0 refills | Status: DC

## 2022-10-03 ENCOUNTER — Encounter: Payer: Self-pay | Admitting: Internal Medicine

## 2022-10-04 MED ORDER — ALPRAZOLAM 1 MG PO TABS: ORAL_TABLET | ORAL | 1 refills | Status: DC

## 2022-10-12 ENCOUNTER — Other Ambulatory Visit: Payer: Self-pay | Admitting: Internal Medicine

## 2022-10-12 DIAGNOSIS — F988 Other specified behavioral and emotional disorders with onset usually occurring in childhood and adolescence: Secondary | ICD-10-CM

## 2022-10-12 MED ORDER — AMPHETAMINE-DEXTROAMPHETAMINE 15 MG PO TABS: 15.0000 mg | ORAL_TABLET | Freq: Every day | ORAL | 0 refills | Status: DC

## 2022-11-13 ENCOUNTER — Other Ambulatory Visit: Payer: Self-pay | Admitting: Internal Medicine

## 2022-11-13 DIAGNOSIS — F988 Other specified behavioral and emotional disorders with onset usually occurring in childhood and adolescence: Secondary | ICD-10-CM

## 2022-11-13 MED ORDER — AMPHETAMINE-DEXTROAMPHETAMINE 15 MG PO TABS: 15.0000 mg | ORAL_TABLET | Freq: Every day | ORAL | 0 refills | Status: DC

## 2022-12-17 ENCOUNTER — Other Ambulatory Visit: Payer: Self-pay | Admitting: Internal Medicine

## 2022-12-17 DIAGNOSIS — F988 Other specified behavioral and emotional disorders with onset usually occurring in childhood and adolescence: Secondary | ICD-10-CM

## 2022-12-17 MED ORDER — AMPHETAMINE-DEXTROAMPHETAMINE 15 MG PO TABS: 15.0000 mg | ORAL_TABLET | Freq: Every day | ORAL | 0 refills | Status: DC

## 2023-01-10 ENCOUNTER — Encounter: Payer: Self-pay | Admitting: Internal Medicine

## 2023-01-11 ENCOUNTER — Other Ambulatory Visit: Payer: Self-pay

## 2023-01-11 MED ORDER — TRIAMCINOLONE ACETONIDE 0.5 % EX OINT: 1.0000 | TOPICAL_OINTMENT | Freq: Two times a day (BID) | CUTANEOUS | 5 refills | Status: AC

## 2023-01-11 MED ORDER — TRIAMCINOLONE ACETONIDE 0.5 % EX OINT: 1.0000 | TOPICAL_OINTMENT | Freq: Two times a day (BID) | CUTANEOUS | 5 refills | Status: DC

## 2023-01-12 ENCOUNTER — Other Ambulatory Visit: Payer: Self-pay | Admitting: Internal Medicine

## 2023-01-12 DIAGNOSIS — F988 Other specified behavioral and emotional disorders with onset usually occurring in childhood and adolescence: Secondary | ICD-10-CM

## 2023-01-13 ENCOUNTER — Encounter: Payer: Self-pay | Admitting: Internal Medicine

## 2023-01-14 MED ORDER — AMPHETAMINE-DEXTROAMPHETAMINE 15 MG PO TABS: 15.0000 mg | ORAL_TABLET | Freq: Every day | ORAL | 0 refills | Status: DC

## 2023-01-14 MED ORDER — ALPRAZOLAM 0.5 MG PO TABS: 0.5000 mg | ORAL_TABLET | Freq: Two times a day (BID) | ORAL | 2 refills | Status: DC | PRN

## 2023-01-30 DIAGNOSIS — M25562 Pain in left knee: Secondary | ICD-10-CM | POA: Diagnosis not present

## 2023-01-30 DIAGNOSIS — M25462 Effusion, left knee: Secondary | ICD-10-CM | POA: Diagnosis not present

## 2023-02-07 DIAGNOSIS — M25562 Pain in left knee: Secondary | ICD-10-CM | POA: Diagnosis not present

## 2023-02-07 DIAGNOSIS — M25462 Effusion, left knee: Secondary | ICD-10-CM | POA: Diagnosis not present

## 2023-02-15 ENCOUNTER — Other Ambulatory Visit: Payer: Self-pay | Admitting: Internal Medicine

## 2023-02-18 MED ORDER — AMPHETAMINE-DEXTROAMPHETAMINE 15 MG PO TABS: 15.0000 mg | ORAL_TABLET | Freq: Every day | ORAL | 0 refills | Status: DC

## 2023-03-08 ENCOUNTER — Encounter: Payer: Self-pay | Admitting: Internal Medicine

## 2023-03-08 ENCOUNTER — Ambulatory Visit (INDEPENDENT_AMBULATORY_CARE_PROVIDER_SITE_OTHER): Payer: BC Managed Care – PPO | Admitting: Internal Medicine

## 2023-03-08 VITALS — BP 122/72 | HR 80 | Temp 99.0°F | Ht 65.0 in | Wt 185.0 lb

## 2023-03-08 DIAGNOSIS — F32A Depression, unspecified: Secondary | ICD-10-CM | POA: Diagnosis not present

## 2023-03-08 DIAGNOSIS — E559 Vitamin D deficiency, unspecified: Secondary | ICD-10-CM

## 2023-03-08 DIAGNOSIS — E538 Deficiency of other specified B group vitamins: Secondary | ICD-10-CM | POA: Diagnosis not present

## 2023-03-08 DIAGNOSIS — R739 Hyperglycemia, unspecified: Secondary | ICD-10-CM

## 2023-03-08 DIAGNOSIS — F988 Other specified behavioral and emotional disorders with onset usually occurring in childhood and adolescence: Secondary | ICD-10-CM

## 2023-03-08 DIAGNOSIS — F411 Generalized anxiety disorder: Secondary | ICD-10-CM | POA: Diagnosis not present

## 2023-03-08 DIAGNOSIS — Z0001 Encounter for general adult medical examination with abnormal findings: Secondary | ICD-10-CM

## 2023-03-08 DIAGNOSIS — E7800 Pure hypercholesterolemia, unspecified: Secondary | ICD-10-CM

## 2023-03-08 LAB — URINALYSIS, ROUTINE W REFLEX MICROSCOPIC
Hgb urine dipstick: NEGATIVE
Ketones, ur: NEGATIVE
Leukocytes,Ua: NEGATIVE
Nitrite: NEGATIVE
Specific Gravity, Urine: >=1.03 — AB (ref 1.000–1.030)
Total Protein, Urine: NEGATIVE
Urine Glucose: NEGATIVE
Urobilinogen, UA: 0.2 (ref 0.0–1.0)
pH: 5.5 (ref 5.0–8.0)

## 2023-03-08 LAB — MICROALBUMIN / CREATININE URINE RATIO
Creatinine,U: 464.7 mg/dL
Microalb Creat Ratio: 0.7 mg/g (ref 0.0–30.0)
Microalb, Ur: 3.5 mg/dL — ABNORMAL HIGH (ref 0.0–1.9)

## 2023-03-08 LAB — CBC WITH DIFFERENTIAL/PLATELET
Basophils Absolute: 0 10*3/uL (ref 0.0–0.1)
Basophils Relative: 0.7 % (ref 0.0–3.0)
Eosinophils Absolute: 0.2 10*3/uL (ref 0.0–0.7)
Eosinophils Relative: 4.8 % (ref 0.0–5.0)
HCT: 42.6 % (ref 36.0–46.0)
Hemoglobin: 14.3 g/dL (ref 12.0–15.0)
Lymphocytes Relative: 30 % (ref 12.0–46.0)
Lymphs Abs: 1.5 10*3/uL (ref 0.7–4.0)
MCHC: 33.6 g/dL (ref 30.0–36.0)
MCV: 97.5 fL (ref 78.0–100.0)
Monocytes Absolute: 0.4 10*3/uL (ref 0.1–1.0)
Monocytes Relative: 8.9 % (ref 3.0–12.0)
Neutro Abs: 2.8 10*3/uL (ref 1.4–7.7)
Neutrophils Relative %: 55.6 % (ref 43.0–77.0)
Platelets: 291 10*3/uL (ref 150.0–400.0)
RBC: 4.37 Mil/uL (ref 3.87–5.11)
RDW: 12.9 % (ref 11.5–15.5)
WBC: 5 10*3/uL (ref 4.0–10.5)

## 2023-03-08 LAB — BASIC METABOLIC PANEL
BUN: 9 mg/dL (ref 6–23)
CO2: 27 meq/L (ref 19–32)
Calcium: 9.7 mg/dL (ref 8.4–10.5)
Chloride: 105 meq/L (ref 96–112)
Creatinine, Ser: 0.76 mg/dL (ref 0.40–1.20)
GFR: 98.16 mL/min (ref >60.00)
Glucose, Bld: 108 mg/dL — ABNORMAL HIGH (ref 70–99)
Potassium: 4.2 meq/L (ref 3.5–5.1)
Sodium: 139 meq/L (ref 135–145)

## 2023-03-08 LAB — HEPATIC FUNCTION PANEL
ALT: 32 U/L (ref 0–35)
AST: 17 U/L (ref 0–37)
Albumin: 4.7 g/dL (ref 3.5–5.2)
Alkaline Phosphatase: 40 U/L (ref 39–117)
Bilirubin, Direct: 0.1 mg/dL (ref 0.0–0.3)
Total Bilirubin: 0.7 mg/dL (ref 0.2–1.2)
Total Protein: 7.3 g/dL (ref 6.0–8.3)

## 2023-03-08 LAB — VITAMIN B12: Vitamin B-12: 520 pg/mL (ref 211–911)

## 2023-03-08 LAB — LIPID PANEL
Cholesterol: 205 mg/dL — ABNORMAL HIGH (ref 0–200)
HDL: 42.9 mg/dL (ref >39.00)
LDL Cholesterol: 138 mg/dL — ABNORMAL HIGH (ref 0–99)
NonHDL: 161.8
Total CHOL/HDL Ratio: 5
Triglycerides: 121 mg/dL (ref 0.0–149.0)
VLDL: 24.2 mg/dL (ref 0.0–40.0)

## 2023-03-08 LAB — VITAMIN D 25 HYDROXY (VIT D DEFICIENCY, FRACTURES): VITD: 30.34 ng/mL (ref 30.00–100.00)

## 2023-03-08 LAB — HEMOGLOBIN A1C: Hgb A1c MFr Bld: 5.9 % (ref 4.6–6.5)

## 2023-03-08 LAB — TSH: TSH: 0.56 u[IU]/mL (ref 0.35–5.50)

## 2023-03-08 NOTE — Patient Instructions (Signed)

## 2023-03-08 NOTE — Assessment & Plan Note (Signed)
Stable overall, cont adderall 15 qd

## 2023-03-08 NOTE — Progress Notes (Signed)
The test results show that your current treatment is OK, as the tests are stable, except the LDL cholesterol is mildly higher.  Please continue to work on lower cholesterol diet as we mentioned at your visit  Otherwise.  Please continue the same plan.  There is no other need for change of treatment or further evaluation based on these results, at this time.  thanks

## 2023-03-08 NOTE — Progress Notes (Signed)
Patient ID: Dana Cervantes, female   DOB: 12/15/1980, 42 y.o.   MRN: [id number]         Chief Complaint:: wellness exam and hld, ADD, depression anxiety       HPI:  Dana Cervantes is a 42 y.o. female here for wellness exam; also for jan 2025 GYN appt., declines flu shot, o/w up to date                         Also s/sp recent fall with left knee MCL and pcl tears for f/u ortho in [place], and may need surgury.  Pt denies chest pain, increased sob or doe, wheezing, orthopnea, PND, increased LE swelling, palpitations, dizziness or syncope.   Pt denies polydipsia, polyuria, or new focal neuro s/s.    Pt denies fever, wt loss, night sweats, loss of appetite, or other constitutional symptoms  ADD meds working well.  Did see GI in apr 2024 with neg evaluation for elevated LFTs.  Denies worsening reflux, abd pain, dysphagia, n/v, bowel change or blood.     Wt Readings from Last 3 Encounters:  03/08/23 185 lb (83.9 kg)  06/04/22 182 lb 6.4 oz (82.7 kg)  03/06/22 189 lb (85.7 kg)   BP Readings from Last 3 Encounters:  03/08/23 122/72  06/04/22 110/74  03/06/22 130/78   Immunization History  Administered Date(s) Administered   Influenza, Seasonal, Injecte, Preservative Fre 01/20/2013, 01/27/2014, 12/21/2015, 12/25/2016, 12/30/2017, 01/08/2019   Influenza,inj,Quad PF,6+ Mos 01/20/2013, 01/27/2014, 12/21/2015, 12/25/2016, 12/30/2017, 01/08/2019   Td 06/03/2008   Td (Adult) 06/03/2008   Tdap 01/08/2019   Health Maintenance Due  Topic Date Due   Cervical Cancer Screening (HPV/Pap Cotest)  Never done      Past Medical History:  Diagnosis Date   ADD 11/09/2006   ALLERGIC RHINITIS 12/08/2008   ANEMIA-IRON DEFICIENCY 06/03/2008   ANXIETY 11/09/2006   DEPRESSION 11/09/2006   ECZEMA 06/02/2009   HYPERLIPIDEMIA 06/03/2008   MIGRAINE HEADACHE 11/09/2006   Urticaria    Past Surgical History:  Procedure Laterality Date   OTHER SURGICAL HISTORY     UTERINE SURGERY age 56 - following prolonged STD    s/p IUD     TONSILLECTOMY      reports that she has never smoked. She has never been exposed to tobacco smoke. She has never used smokeless tobacco. She reports current alcohol use. She reports that she does not use drugs. family history includes Allergic rhinitis in her father; Cancer in her father; Hyperlipidemia in her father. No Known Allergies Current Outpatient Medications on File Prior to Visit  Medication Sig Dispense Refill   ALPRAZolam (XANAX) 0.5 MG tablet Take 1 tablet (0.5 mg total) by mouth 2 (two) times daily as needed for anxiety. 60 tablet 2   amphetamine-dextroamphetamine (ADDERALL) 15 MG tablet Take 1 tablet by mouth daily. 30 tablet 0   azelastine (OPTIVAR) 0.05 % ophthalmic solution Place 1 drop into both eyes 2 (two) times daily. 6 mL 12   Cholecalciferol (VITAMIN D3 PO) Take 5,000 Int'l Units/day by mouth daily.     fluticasone (FLONASE) 50 MCG/ACT nasal spray Place 1-2 sprays into both nostrils daily.     ibuprofen (ADVIL) 200 MG tablet Take 200 mg by mouth every 6 (six) hours as needed.     levonorgestrel (MIRENA, 52 MG,) 20 MCG/24HR IUD Mirena 20 mcg/24 hours (6 yrs) 52 mg intrauterine device  Take 1 device by intrauterine route.     LORATADINE  PO Take 10 mg by mouth daily. Equate allergy     triamcinolone ointment (KENALOG) 0.5 % Apply 1 Application topically 2 (two) times daily. 30 g 5   UNABLE TO FIND Take 10 mg by mouth daily. Equate allergy relief 5 mg cetrizine/20 mg of decongestant     vitamin C (ASCORBIC ACID) 500 MG tablet Take 500 mg by mouth daily.     No current facility-administered medications on file prior to visit.        ROS:  All others reviewed and negative.  Objective        PE:  BP 122/72 (BP Location: Right Arm, Patient Position: Sitting, Cuff Size: Normal)   Pulse 80   Temp 99 F (37.2 C) (Oral)   Ht 5\' 5"  (1.651 m)   Wt 185 lb (83.9 kg)   LMP 02/08/2023 (Approximate)   SpO2 99%   BMI 30.79 kg/m                 Constitutional:  Pt appears in NAD               HENT: Head: NCAT.                Right Ear: External ear normal.                 Left Ear: External ear normal.                Eyes: . Pupils are equal, round, and reactive to light. Conjunctivae and EOM are normal               Nose: without d/c or deformity               Neck: Neck supple. Gross normal ROM               Cardiovascular: Normal rate and regular rhythm.                 Pulmonary/Chest: Effort normal and breath sounds without rales or wheezing.                Abd:  Soft, NT, ND, + BS, no organomegaly               Neurological: Pt is alert. At baseline orientation, motor grossly intact               Skin: Skin is warm. No rashes, no other new lesions, LE edema - none               Psychiatric: Pt behavior is normal without agitation , mild nervous, not depressed affect  Micro: none  Cardiac tracings I have personally interpreted today:  none  Pertinent Radiological findings (summarize): none   Lab Results  Component Value Date   WBC 4.0 03/06/2022   HGB 13.8 03/06/2022   HCT 40.1 03/06/2022   PLT 223.0 03/06/2022   GLUCOSE 98 03/06/2022   CHOL 246 (H) 03/06/2022   TRIG 368.0 (H) 03/06/2022   HDL 71.60 03/06/2022   LDLDIRECT 144.0 03/06/2022   LDLCALC 110 (H) 03/01/2021   ALT 48 (H) 06/04/2022   AST 22 06/04/2022   NA 137 03/06/2022   K 3.9 03/06/2022   CL 100 03/06/2022   CREATININE 0.70 03/06/2022   BUN 10 03/06/2022   CO2 27 03/06/2022   TSH 1.30 03/06/2022   HGBA1C 5.8 03/06/2022   Assessment/Plan:  Dana Cervantes is a 42 y.o. White or Caucasian [1]  female with  has a past medical history of ADD (11/09/2006), ALLERGIC RHINITIS (12/08/2008), ANEMIA-IRON DEFICIENCY (06/03/2008), ANXIETY (11/09/2006), DEPRESSION (11/09/2006), ECZEMA (06/02/2009), HYPERLIPIDEMIA (06/03/2008), MIGRAINE HEADACHE (11/09/2006), and Urticaria.  Encounter for well adult exam with abnormal findings Age and sex appropriate education and counseling  updated with regular exercise and diet Referrals for preventative services - for Gyn appt soon Immunizations addressed - declines flu shot Smoking counseling  - none needed Evidence for depression or other mood disorder - none significant Most recent labs reviewed. I have personally reviewed and have noted: 1) the patient's medical and social history 2) The patient's current medications and supplements 3) The patient's height, weight, and BMI have been recorded in the chart   Hyperlipidemia Lab Results  Component Value Date   LDLCALC 110 (H) 03/01/2021   Uncontrolled, for lower chol diet, declines statin,   Depression Stable, declines need for ssri or counseling for now  Anxiety state Stable, cont xanax prn  Attention deficit disorder Stable overall, cont adderall 15 qd  Followup: Return in about 1 year (around 03/07/2024).  Oliver Barre, MD 03/08/2023 1:17 PM [facility]

## 2023-03-08 NOTE — Assessment & Plan Note (Signed)
Stable, cont xanax prn

## 2023-03-08 NOTE — Assessment & Plan Note (Signed)
Stable, declines need for ssri or counseling for now

## 2023-03-08 NOTE — Assessment & Plan Note (Signed)
Lab Results  Component Value Date   LDLCALC 110 (H) 03/01/2021   Uncontrolled, for lower chol diet, declines statin,

## 2023-03-08 NOTE — Assessment & Plan Note (Signed)
Age and sex appropriate education and counseling updated with regular exercise and diet Referrals for preventative services - for Gyn appt soon Immunizations addressed - declines flu shot Smoking counseling  - none needed Evidence for depression or other mood disorder - none significant Most recent labs reviewed. I have personally reviewed and have noted: 1) the patient's medical and social history 2) The patient's current medications and supplements 3) The patient's height, weight, and BMI have been recorded in the chart

## 2023-03-19 ENCOUNTER — Other Ambulatory Visit: Payer: Self-pay | Admitting: Internal Medicine

## 2023-03-19 MED ORDER — AMPHETAMINE-DEXTROAMPHETAMINE 15 MG PO TABS: 15.0000 mg | ORAL_TABLET | Freq: Every day | ORAL | 0 refills | Status: DC

## 2023-04-01 DIAGNOSIS — Z1231 Encounter for screening mammogram for malignant neoplasm of breast: Secondary | ICD-10-CM | POA: Diagnosis not present

## 2023-04-01 DIAGNOSIS — Z01419 Encounter for gynecological examination (general) (routine) without abnormal findings: Secondary | ICD-10-CM | POA: Diagnosis not present

## 2023-04-03 DIAGNOSIS — S83412S Sprain of medial collateral ligament of left knee, sequela: Secondary | ICD-10-CM | POA: Diagnosis not present

## 2023-04-03 DIAGNOSIS — S83522D Sprain of posterior cruciate ligament of left knee, subsequent encounter: Secondary | ICD-10-CM | POA: Diagnosis not present

## 2023-04-03 DIAGNOSIS — R29898 Other symptoms and signs involving the musculoskeletal system: Secondary | ICD-10-CM | POA: Diagnosis not present

## 2023-04-03 DIAGNOSIS — M25562 Pain in left knee: Secondary | ICD-10-CM | POA: Diagnosis not present

## 2023-04-08 DIAGNOSIS — M25562 Pain in left knee: Secondary | ICD-10-CM | POA: Diagnosis not present

## 2023-04-08 DIAGNOSIS — S83412D Sprain of medial collateral ligament of left knee, subsequent encounter: Secondary | ICD-10-CM | POA: Diagnosis not present

## 2023-04-09 ENCOUNTER — Other Ambulatory Visit: Payer: Self-pay | Admitting: Internal Medicine

## 2023-04-12 ENCOUNTER — Other Ambulatory Visit: Payer: Self-pay | Admitting: Internal Medicine

## 2023-04-12 MED ORDER — ALPRAZOLAM 0.5 MG PO TABS: 0.5000 mg | ORAL_TABLET | Freq: Two times a day (BID) | ORAL | 2 refills | Status: DC | PRN

## 2023-04-17 ENCOUNTER — Other Ambulatory Visit: Payer: Self-pay | Admitting: Internal Medicine

## 2023-04-18 ENCOUNTER — Encounter: Payer: Self-pay | Admitting: Internal Medicine

## 2023-04-18 MED ORDER — AMPHETAMINE-DEXTROAMPHETAMINE 15 MG PO TABS: 15.0000 mg | ORAL_TABLET | Freq: Every day | ORAL | 0 refills | Status: DC

## 2023-04-18 MED ORDER — AZITHROMYCIN 250 MG PO TABS: ORAL_TABLET | ORAL | 1 refills | Status: AC

## 2023-05-05 ENCOUNTER — Encounter: Payer: Self-pay | Admitting: Internal Medicine

## 2023-05-07 MED ORDER — ALPRAZOLAM 0.5 MG PO TABS: 0.5000 mg | ORAL_TABLET | Freq: Four times a day (QID) | ORAL | 2 refills | Status: DC | PRN

## 2023-05-07 MED ORDER — ALPRAZOLAM 0.5 MG PO TABS: 0.5000 mg | ORAL_TABLET | Freq: Four times a day (QID) | ORAL | 0 refills | Status: DC | PRN

## 2023-05-07 NOTE — Addendum Note (Signed)
Addended by: Corwin Levins on: 05/07/2023 01:17 PM   Modules accepted: Orders

## 2023-05-13 MED ORDER — ALPRAZOLAM 0.5 MG PO TABS: 0.5000 mg | ORAL_TABLET | Freq: Four times a day (QID) | ORAL | 2 refills | Status: DC | PRN

## 2023-05-13 NOTE — Telephone Encounter (Signed)
 I sent 2 rx - one for short, one for longer to [facility] on feb 18

## 2023-05-13 NOTE — Addendum Note (Signed)
 Addended by: Corwin Levins on: 05/13/2023 02:51 PM   Modules accepted: Orders

## 2023-05-13 NOTE — Telephone Encounter (Signed)
 [place] rx sent to [facility].

## 2023-05-18 ENCOUNTER — Other Ambulatory Visit: Payer: Self-pay | Admitting: Internal Medicine

## 2023-05-20 MED ORDER — AMPHETAMINE-DEXTROAMPHETAMINE 15 MG PO TABS
15.0000 mg | ORAL_TABLET | Freq: Every day | ORAL | 0 refills | Status: DC
Start: 2023-05-20 — End: 2023-06-17

## 2023-06-15 ENCOUNTER — Other Ambulatory Visit: Payer: Self-pay | Admitting: Internal Medicine

## 2023-06-17 ENCOUNTER — Other Ambulatory Visit: Payer: Self-pay | Admitting: Internal Medicine

## 2023-06-17 MED ORDER — AMPHETAMINE-DEXTROAMPHETAMINE 15 MG PO TABS: 15.0000 mg | ORAL_TABLET | Freq: Every day | ORAL | 0 refills | Status: DC

## 2023-06-18 MED ORDER — AMPHETAMINE-DEXTROAMPHETAMINE 15 MG PO TABS: 15.0000 mg | ORAL_TABLET | Freq: Every day | ORAL | 0 refills | Status: DC

## 2023-07-16 ENCOUNTER — Other Ambulatory Visit: Payer: Self-pay | Admitting: Internal Medicine

## 2023-07-17 MED ORDER — AMPHETAMINE-DEXTROAMPHETAMINE 15 MG PO TABS: 15.0000 mg | ORAL_TABLET | Freq: Every day | ORAL | 0 refills | Status: DC

## 2023-07-24 ENCOUNTER — Other Ambulatory Visit: Payer: Self-pay | Admitting: Internal Medicine

## 2023-08-14 ENCOUNTER — Encounter: Payer: Self-pay | Admitting: Internal Medicine

## 2023-08-14 MED ORDER — AMPHETAMINE-DEXTROAMPHETAMINE 15 MG PO TABS: 15.0000 mg | ORAL_TABLET | Freq: Every day | ORAL | 0 refills | Status: DC

## 2023-08-16 MED ORDER — AMPHETAMINE-DEXTROAMPHETAMINE 15 MG PO TABS: 15.0000 mg | ORAL_TABLET | Freq: Every day | ORAL | 0 refills | Status: DC

## 2023-08-16 NOTE — Addendum Note (Signed)
 Addended by: Roslyn Coombe on: 08/16/2023 03:38 PM   Modules accepted: Orders

## 2023-09-17 ENCOUNTER — Encounter: Payer: Self-pay | Admitting: Internal Medicine

## 2023-09-17 MED ORDER — AMPHETAMINE-DEXTROAMPHETAMINE 15 MG PO TABS: 15.0000 mg | ORAL_TABLET | Freq: Every day | ORAL | 0 refills | Status: DC

## 2023-10-17 ENCOUNTER — Encounter: Payer: Self-pay | Admitting: Internal Medicine

## 2023-10-18 MED ORDER — AMPHETAMINE-DEXTROAMPHETAMINE 15 MG PO TABS: 15.0000 mg | ORAL_TABLET | Freq: Every day | ORAL | 0 refills | Status: DC

## 2023-10-21 ENCOUNTER — Other Ambulatory Visit: Payer: Self-pay | Admitting: Internal Medicine

## 2023-11-13 ENCOUNTER — Encounter: Payer: Self-pay | Admitting: Internal Medicine

## 2023-11-14 MED ORDER — AMPHETAMINE-DEXTROAMPHETAMINE 15 MG PO TABS: 15.0000 mg | ORAL_TABLET | Freq: Every day | ORAL | 0 refills | Status: DC

## 2023-12-14 ENCOUNTER — Encounter: Payer: Self-pay | Admitting: Internal Medicine

## 2023-12-17 MED ORDER — AMPHETAMINE-DEXTROAMPHETAMINE 15 MG PO TABS: 15.0000 mg | ORAL_TABLET | Freq: Every day | ORAL | 0 refills | Status: DC

## 2023-12-24 DIAGNOSIS — D239 Other benign neoplasm of skin, unspecified: Secondary | ICD-10-CM | POA: Diagnosis not present

## 2023-12-24 DIAGNOSIS — L814 Other melanin hyperpigmentation: Secondary | ICD-10-CM | POA: Diagnosis not present

## 2023-12-24 DIAGNOSIS — D225 Melanocytic nevi of trunk: Secondary | ICD-10-CM | POA: Diagnosis not present

## 2023-12-24 DIAGNOSIS — D229 Melanocytic nevi, unspecified: Secondary | ICD-10-CM | POA: Diagnosis not present

## 2024-01-09 ENCOUNTER — Other Ambulatory Visit: Payer: Self-pay | Admitting: Internal Medicine

## 2024-01-16 ENCOUNTER — Encounter: Payer: Self-pay | Admitting: Internal Medicine

## 2024-01-16 MED ORDER — AMPHETAMINE-DEXTROAMPHETAMINE 15 MG PO TABS: 15.0000 mg | ORAL_TABLET | Freq: Every day | ORAL | 0 refills | Status: DC

## 2024-02-16 ENCOUNTER — Encounter: Payer: Self-pay | Admitting: Internal Medicine

## 2024-02-17 MED ORDER — AMPHETAMINE-DEXTROAMPHETAMINE 15 MG PO TABS: 15.0000 mg | ORAL_TABLET | Freq: Every day | ORAL | 0 refills | Status: DC

## 2024-03-15 ENCOUNTER — Encounter: Payer: Self-pay | Admitting: Internal Medicine

## 2024-03-16 MED ORDER — AMPHETAMINE-DEXTROAMPHETAMINE 15 MG PO TABS: 15.0000 mg | ORAL_TABLET | Freq: Every day | ORAL | 0 refills | Status: DC

## 2024-03-23 ENCOUNTER — Encounter: Payer: Self-pay | Admitting: Internal Medicine

## 2024-03-23 MED ORDER — ALPRAZOLAM 0.5 MG PO TABS: 0.5000 mg | ORAL_TABLET | Freq: Four times a day (QID) | ORAL | 1 refills | Status: AC | PRN

## 2024-04-10 ENCOUNTER — Encounter: Payer: Self-pay | Admitting: Internal Medicine

## 2024-04-10 MED ORDER — AMPHETAMINE-DEXTROAMPHETAMINE 15 MG PO TABS: 15.0000 mg | ORAL_TABLET | Freq: Every day | ORAL | 0 refills | Status: DC

## 2024-04-21 MED ORDER — AMPHETAMINE-DEXTROAMPHETAMINE 15 MG PO TABS: 15.0000 mg | ORAL_TABLET | Freq: Every day | ORAL | 0 refills | Status: AC
